# Patient Record
Sex: Female | Born: 1950
Health system: Southern US, Community
[De-identification: ages and names within clinical notes are randomized; demographics above are authoritative.]

## PROBLEM LIST (undated history)

## (undated) DIAGNOSIS — E119 Type 2 diabetes mellitus without complications: Secondary | ICD-10-CM

## (undated) DIAGNOSIS — C419 Malignant neoplasm of bone and articular cartilage, unspecified: Secondary | ICD-10-CM

## (undated) DIAGNOSIS — C50919 Malignant neoplasm of unspecified site of unspecified female breast: Secondary | ICD-10-CM

## (undated) DIAGNOSIS — Z9221 Personal history of antineoplastic chemotherapy: Secondary | ICD-10-CM

## (undated) DIAGNOSIS — E785 Hyperlipidemia, unspecified: Secondary | ICD-10-CM

## (undated) DIAGNOSIS — I10 Essential (primary) hypertension: Secondary | ICD-10-CM

## (undated) HISTORY — DX: Hyperlipidemia, unspecified: E78.5

## (undated) HISTORY — DX: Type 2 diabetes mellitus without complications: E11.9

## (undated) HISTORY — DX: Malignant neoplasm of bone and articular cartilage, unspecified: C41.9

## (undated) HISTORY — DX: Malignant neoplasm of unspecified site of unspecified female breast: C50.919

## (undated) HISTORY — DX: Essential (primary) hypertension: I10

## (undated) HISTORY — PX: MASTECTOMY: SHX3

## (undated) HISTORY — PX: APPENDECTOMY: SHX54

---

## 1997-07-08 ENCOUNTER — Other Ambulatory Visit: Admission: RE | Admit: 1997-07-08 | Discharge: 1997-07-08 | Payer: Self-pay | Admitting: Obstetrics and Gynecology

## 1997-08-21 ENCOUNTER — Ambulatory Visit (HOSPITAL_COMMUNITY): Admission: RE | Admit: 1997-08-21 | Discharge: 1997-08-21 | Payer: Self-pay | Admitting: Family Medicine

## 1997-08-25 ENCOUNTER — Ambulatory Visit (HOSPITAL_COMMUNITY): Admission: RE | Admit: 1997-08-25 | Discharge: 1997-08-25 | Payer: Self-pay | Admitting: Family Medicine

## 1998-01-09 DIAGNOSIS — C50919 Malignant neoplasm of unspecified site of unspecified female breast: Secondary | ICD-10-CM

## 1998-01-09 HISTORY — DX: Malignant neoplasm of unspecified site of unspecified female breast: C50.919

## 1998-01-13 ENCOUNTER — Ambulatory Visit (HOSPITAL_COMMUNITY): Admission: RE | Admit: 1998-01-13 | Discharge: 1998-01-13 | Payer: Self-pay | Admitting: *Deleted

## 1998-02-05 ENCOUNTER — Inpatient Hospital Stay (HOSPITAL_COMMUNITY): Admission: RE | Admit: 1998-02-05 | Discharge: 1998-02-08 | Payer: Self-pay | Admitting: *Deleted

## 1998-03-19 ENCOUNTER — Ambulatory Visit (HOSPITAL_COMMUNITY): Admission: RE | Admit: 1998-03-19 | Discharge: 1998-03-19 | Payer: Self-pay | Admitting: *Deleted

## 1998-03-30 ENCOUNTER — Encounter: Payer: Self-pay | Admitting: Oncology

## 1998-03-30 ENCOUNTER — Ambulatory Visit (HOSPITAL_COMMUNITY): Admission: RE | Admit: 1998-03-30 | Discharge: 1998-03-30 | Payer: Self-pay | Admitting: Oncology

## 1998-07-27 ENCOUNTER — Other Ambulatory Visit: Admission: RE | Admit: 1998-07-27 | Discharge: 1998-07-27 | Payer: Self-pay | Admitting: Obstetrics and Gynecology

## 1998-08-11 ENCOUNTER — Ambulatory Visit (HOSPITAL_BASED_OUTPATIENT_CLINIC_OR_DEPARTMENT_OTHER): Admission: RE | Admit: 1998-08-11 | Discharge: 1998-08-11 | Payer: Self-pay | Admitting: Plastic Surgery

## 1998-11-05 ENCOUNTER — Ambulatory Visit (HOSPITAL_BASED_OUTPATIENT_CLINIC_OR_DEPARTMENT_OTHER): Admission: RE | Admit: 1998-11-05 | Discharge: 1998-11-05 | Payer: Self-pay | Admitting: Plastic Surgery

## 1998-12-28 ENCOUNTER — Ambulatory Visit (HOSPITAL_COMMUNITY): Admission: RE | Admit: 1998-12-28 | Discharge: 1998-12-28 | Payer: Self-pay | Admitting: Oncology

## 1998-12-28 ENCOUNTER — Encounter: Payer: Self-pay | Admitting: Oncology

## 1999-09-05 ENCOUNTER — Encounter: Admission: RE | Admit: 1999-09-05 | Discharge: 1999-12-04 | Payer: Self-pay | Admitting: Family Medicine

## 1999-09-21 ENCOUNTER — Other Ambulatory Visit: Admission: RE | Admit: 1999-09-21 | Discharge: 1999-09-21 | Payer: Self-pay | Admitting: Obstetrics and Gynecology

## 1999-12-30 ENCOUNTER — Encounter: Admission: RE | Admit: 1999-12-30 | Discharge: 1999-12-30 | Payer: Self-pay | Admitting: Oncology

## 1999-12-30 ENCOUNTER — Encounter: Payer: Self-pay | Admitting: Oncology

## 2000-01-11 ENCOUNTER — Encounter: Admission: RE | Admit: 2000-01-11 | Discharge: 2000-04-10 | Payer: Self-pay | Admitting: Family Medicine

## 2000-09-21 ENCOUNTER — Other Ambulatory Visit: Admission: RE | Admit: 2000-09-21 | Discharge: 2000-09-21 | Payer: Self-pay | Admitting: Obstetrics and Gynecology

## 2001-01-08 ENCOUNTER — Encounter: Admission: RE | Admit: 2001-01-08 | Discharge: 2001-01-08 | Payer: Self-pay | Admitting: Oncology

## 2001-01-08 ENCOUNTER — Encounter: Payer: Self-pay | Admitting: Oncology

## 2001-06-20 ENCOUNTER — Encounter: Admission: RE | Admit: 2001-06-20 | Discharge: 2001-06-20 | Payer: Self-pay | Admitting: Oncology

## 2001-06-20 ENCOUNTER — Encounter: Payer: Self-pay | Admitting: Oncology

## 2001-10-31 ENCOUNTER — Other Ambulatory Visit: Admission: RE | Admit: 2001-10-31 | Discharge: 2001-10-31 | Payer: Self-pay | Admitting: Obstetrics and Gynecology

## 2002-06-25 ENCOUNTER — Encounter: Payer: Self-pay | Admitting: General Surgery

## 2002-06-25 ENCOUNTER — Encounter: Admission: RE | Admit: 2002-06-25 | Discharge: 2002-06-25 | Payer: Self-pay | Admitting: General Surgery

## 2002-11-20 ENCOUNTER — Other Ambulatory Visit: Admission: RE | Admit: 2002-11-20 | Discharge: 2002-11-20 | Payer: Self-pay | Admitting: Obstetrics and Gynecology

## 2003-07-08 ENCOUNTER — Encounter: Admission: RE | Admit: 2003-07-08 | Discharge: 2003-07-08 | Payer: Self-pay | Admitting: General Surgery

## 2003-12-31 ENCOUNTER — Other Ambulatory Visit: Admission: RE | Admit: 2003-12-31 | Discharge: 2003-12-31 | Payer: Self-pay | Admitting: Obstetrics and Gynecology

## 2004-01-25 ENCOUNTER — Encounter: Admission: RE | Admit: 2004-01-25 | Discharge: 2004-01-25 | Payer: Self-pay

## 2004-03-23 ENCOUNTER — Ambulatory Visit: Payer: Self-pay | Admitting: Oncology

## 2004-07-13 ENCOUNTER — Encounter: Admission: RE | Admit: 2004-07-13 | Discharge: 2004-07-13 | Payer: Self-pay | Admitting: Oncology

## 2005-03-22 ENCOUNTER — Ambulatory Visit: Payer: Self-pay | Admitting: Oncology

## 2005-08-17 ENCOUNTER — Encounter: Admission: RE | Admit: 2005-08-17 | Discharge: 2005-08-17 | Payer: Self-pay | Admitting: Oncology

## 2006-03-19 ENCOUNTER — Ambulatory Visit: Payer: Self-pay | Admitting: Oncology

## 2006-08-22 ENCOUNTER — Encounter: Admission: RE | Admit: 2006-08-22 | Discharge: 2006-08-22 | Payer: Self-pay | Admitting: Oncology

## 2007-03-19 ENCOUNTER — Ambulatory Visit: Payer: Self-pay | Admitting: Oncology

## 2007-08-26 ENCOUNTER — Encounter: Admission: RE | Admit: 2007-08-26 | Discharge: 2007-08-26 | Payer: Self-pay | Admitting: Oncology

## 2008-03-24 ENCOUNTER — Ambulatory Visit: Payer: Self-pay | Admitting: Oncology

## 2008-08-25 ENCOUNTER — Encounter: Admission: RE | Admit: 2008-08-25 | Discharge: 2008-08-25 | Payer: Self-pay | Admitting: Family Medicine

## 2008-08-26 ENCOUNTER — Encounter: Admission: RE | Admit: 2008-08-26 | Discharge: 2008-08-26 | Payer: Self-pay | Admitting: Oncology

## 2009-03-25 ENCOUNTER — Ambulatory Visit: Payer: Self-pay | Admitting: Oncology

## 2009-08-30 ENCOUNTER — Encounter: Admission: RE | Admit: 2009-08-30 | Discharge: 2009-08-30 | Payer: Self-pay | Admitting: Oncology

## 2009-11-19 ENCOUNTER — Encounter: Admission: RE | Admit: 2009-11-19 | Discharge: 2009-11-19 | Payer: Self-pay | Admitting: Family Medicine

## 2010-01-30 ENCOUNTER — Encounter: Payer: Self-pay | Admitting: Oncology

## 2010-03-31 ENCOUNTER — Encounter (HOSPITAL_BASED_OUTPATIENT_CLINIC_OR_DEPARTMENT_OTHER): Payer: BC Managed Care – PPO | Admitting: Oncology

## 2010-03-31 DIAGNOSIS — C50319 Malignant neoplasm of lower-inner quadrant of unspecified female breast: Secondary | ICD-10-CM

## 2010-03-31 DIAGNOSIS — Z853 Personal history of malignant neoplasm of breast: Secondary | ICD-10-CM

## 2010-07-11 ENCOUNTER — Other Ambulatory Visit: Payer: Self-pay | Admitting: Oncology

## 2010-07-11 DIAGNOSIS — Z9010 Acquired absence of unspecified breast and nipple: Secondary | ICD-10-CM

## 2010-07-15 ENCOUNTER — Other Ambulatory Visit: Payer: Self-pay | Admitting: Internal Medicine

## 2010-07-15 DIAGNOSIS — E041 Nontoxic single thyroid nodule: Secondary | ICD-10-CM

## 2010-07-26 ENCOUNTER — Other Ambulatory Visit (HOSPITAL_COMMUNITY)
Admission: RE | Admit: 2010-07-26 | Discharge: 2010-07-26 | Disposition: A | Discharge status: Home / Self Care | Payer: BC Managed Care – PPO | Source: Ambulatory Visit | Attending: Interventional Radiology | Admitting: Interventional Radiology

## 2010-07-26 ENCOUNTER — Ambulatory Visit
Admission: RE | Admit: 2010-07-26 | Discharge: 2010-07-26 | Disposition: A | Discharge status: Home / Self Care | Payer: BC Managed Care – PPO | Source: Ambulatory Visit | Attending: Internal Medicine | Admitting: Internal Medicine

## 2010-07-26 DIAGNOSIS — E041 Nontoxic single thyroid nodule: Secondary | ICD-10-CM

## 2010-07-26 DIAGNOSIS — E049 Nontoxic goiter, unspecified: Secondary | ICD-10-CM | POA: Insufficient documentation

## 2010-09-01 ENCOUNTER — Ambulatory Visit: Payer: BC Managed Care – PPO

## 2010-09-05 ENCOUNTER — Ambulatory Visit
Admission: RE | Admit: 2010-09-05 | Discharge: 2010-09-05 | Disposition: A | Discharge status: Home / Self Care | Payer: BC Managed Care – PPO | Source: Ambulatory Visit | Attending: Oncology | Admitting: Oncology

## 2010-09-05 DIAGNOSIS — Z9010 Acquired absence of unspecified breast and nipple: Secondary | ICD-10-CM

## 2010-10-18 ENCOUNTER — Other Ambulatory Visit: Payer: Self-pay | Admitting: Internal Medicine

## 2010-10-18 DIAGNOSIS — E042 Nontoxic multinodular goiter: Secondary | ICD-10-CM

## 2010-11-21 ENCOUNTER — Other Ambulatory Visit: Payer: BC Managed Care – PPO

## 2011-03-13 ENCOUNTER — Telehealth: Payer: Self-pay | Admitting: Oncology

## 2011-03-13 NOTE — Telephone Encounter (Signed)
lmonvm for pt re appt for 4/1. Schedule mailed.

## 2011-04-07 ENCOUNTER — Telehealth: Payer: Self-pay | Admitting: Oncology

## 2011-04-07 NOTE — Telephone Encounter (Signed)
pt called and r/s appt on 4-01 to 6-17

## 2011-04-10 ENCOUNTER — Ambulatory Visit: Payer: BC Managed Care – PPO | Admitting: Oncology

## 2011-06-26 ENCOUNTER — Ambulatory Visit: Payer: BC Managed Care – PPO | Admitting: Oncology

## 2011-07-14 ENCOUNTER — Other Ambulatory Visit: Payer: Self-pay | Admitting: Oncology

## 2011-07-14 DIAGNOSIS — Z1231 Encounter for screening mammogram for malignant neoplasm of breast: Secondary | ICD-10-CM

## 2011-07-21 ENCOUNTER — Ambulatory Visit (HOSPITAL_BASED_OUTPATIENT_CLINIC_OR_DEPARTMENT_OTHER): Payer: BC Managed Care – PPO | Admitting: Oncology

## 2011-07-21 ENCOUNTER — Telehealth: Payer: Self-pay | Admitting: Oncology

## 2011-07-21 ENCOUNTER — Encounter: Payer: Self-pay | Admitting: *Deleted

## 2011-07-21 VITALS — BP 144/82 | HR 77 | Temp 99.1°F | Ht 62.0 in | Wt 163.1 lb

## 2011-07-21 DIAGNOSIS — C50919 Malignant neoplasm of unspecified site of unspecified female breast: Secondary | ICD-10-CM

## 2011-07-21 NOTE — Progress Notes (Signed)
   [facility]    OFFICE PROGRESS NOTE   INTERVAL HISTORY:   She returns as scheduled. No complaint. Good energy level. No change at the right chest wall or left breast. A mammogram in August of 2012 was negative.  Objective:  Vital signs in last 24 hours:  Blood pressure 144/82, pulse 77, temperature 99.1 F (37.3 C), temperature source Oral, height 5\' 2"  (1.575 m), weight 163 lb 1.6 oz (73.982 kg).    HEENT: Neck without mass Lymphatics: No cervical, supraclavicular, or right axillary nodes. Prominent high medial left axillary fat pad versus a soft mobile 1 cm node. Resp: Lungs clear bilaterally Cardio: Regular rate and rhythm GI: No hepatomegaly Vascular: No leg edema, the left ankle area is slightly larger than the right side. Breasts: Status post right mastectomy with a TRAM reconstruction. No evidence for chest wall tumor recurrence. Left breast without mass.     Medications: I have reviewed the patient's current medications.  Assessment/Plan: Ms. Verdi has a history of Stage II-A right-sided breast cancer diagnosed in January of 2000.  She completed adjuvant Femara therapy at the end of July of 2010.  She remains in clinical remission.    Disposition:  She would like to continue followup at the cancer Center. She will be scheduled for a mammogram in September of 2013. Ms. Bovey will return for an office visit in one year.   Thornton Papas, MD  07/21/2011  9:06 AM

## 2011-07-21 NOTE — Telephone Encounter (Signed)
gve the pt her July 2014 appt calendar °

## 2011-09-06 ENCOUNTER — Ambulatory Visit
Admission: RE | Admit: 2011-09-06 | Discharge: 2011-09-06 | Disposition: A | Discharge status: Home / Self Care | Payer: BC Managed Care – PPO | Source: Ambulatory Visit | Attending: Oncology | Admitting: Oncology

## 2011-09-06 DIAGNOSIS — Z1231 Encounter for screening mammogram for malignant neoplasm of breast: Secondary | ICD-10-CM

## 2012-07-22 ENCOUNTER — Ambulatory Visit: Payer: BC Managed Care – PPO | Admitting: Oncology

## 2012-08-27 ENCOUNTER — Other Ambulatory Visit: Payer: Self-pay

## 2012-08-27 DIAGNOSIS — Z9011 Acquired absence of right breast and nipple: Secondary | ICD-10-CM

## 2012-08-27 DIAGNOSIS — Z1231 Encounter for screening mammogram for malignant neoplasm of breast: Secondary | ICD-10-CM

## 2012-09-17 ENCOUNTER — Ambulatory Visit
Admission: RE | Admit: 2012-09-17 | Discharge: 2012-09-17 | Disposition: A | Discharge status: Home / Self Care | Payer: BC Managed Care – PPO | Source: Ambulatory Visit

## 2012-09-17 DIAGNOSIS — Z1231 Encounter for screening mammogram for malignant neoplasm of breast: Secondary | ICD-10-CM

## 2012-09-17 DIAGNOSIS — Z9011 Acquired absence of right breast and nipple: Secondary | ICD-10-CM

## 2013-01-16 ENCOUNTER — Other Ambulatory Visit: Payer: Self-pay | Admitting: Obstetrics and Gynecology

## 2013-02-12 ENCOUNTER — Other Ambulatory Visit (HOSPITAL_COMMUNITY): Payer: Self-pay | Admitting: Orthopedic Surgery

## 2013-02-12 DIAGNOSIS — C50919 Malignant neoplasm of unspecified site of unspecified female breast: Secondary | ICD-10-CM

## 2013-02-17 ENCOUNTER — Encounter (HOSPITAL_COMMUNITY)
Admission: RE | Admit: 2013-02-17 | Discharge: 2013-02-17 | Disposition: A | Discharge status: Home / Self Care | Payer: BC Managed Care – PPO | Source: Ambulatory Visit | Attending: Orthopedic Surgery | Admitting: Orthopedic Surgery

## 2013-02-17 ENCOUNTER — Ambulatory Visit (HOSPITAL_COMMUNITY)
Admission: RE | Admit: 2013-02-17 | Discharge: 2013-02-17 | Disposition: A | Discharge status: Home / Self Care | Payer: BC Managed Care – PPO | Source: Ambulatory Visit | Attending: Orthopedic Surgery | Admitting: Orthopedic Surgery

## 2013-02-17 DIAGNOSIS — C50919 Malignant neoplasm of unspecified site of unspecified female breast: Secondary | ICD-10-CM | POA: Insufficient documentation

## 2013-02-17 DIAGNOSIS — M25559 Pain in unspecified hip: Secondary | ICD-10-CM | POA: Insufficient documentation

## 2013-02-17 DIAGNOSIS — R948 Abnormal results of function studies of other organs and systems: Secondary | ICD-10-CM | POA: Insufficient documentation

## 2013-02-17 MED ORDER — TECHNETIUM TC 99M MEDRONATE IV KIT
25.0000 | PACK | Freq: Once | INTRAVENOUS | Status: AC | PRN
  Administered 2013-02-17: 25 via INTRAVENOUS

## 2013-02-18 ENCOUNTER — Telehealth: Payer: Self-pay | Admitting: *Deleted

## 2013-02-18 NOTE — Telephone Encounter (Signed)
Patient called to report recent xrays-MRI is suspicious for bone mets in her right hip. Dr. Elsie Saas told her to call her oncologist. She is requesting appointment as soon as possible.

## 2013-02-18 NOTE — Telephone Encounter (Signed)
Called patient with her appointment with Dr. Benay Spice for 2/12 at 3:30 pm (1st available per [facility]).

## 2013-02-18 NOTE — Telephone Encounter (Signed)
Called Dr. Archie Endo office and requested copy of last office note and all xray and MRI reports available be sent to office today. We have access to the bone scan.

## 2013-02-20 ENCOUNTER — Ambulatory Visit (HOSPITAL_BASED_OUTPATIENT_CLINIC_OR_DEPARTMENT_OTHER): Payer: BC Managed Care – PPO | Admitting: Oncology

## 2013-02-20 ENCOUNTER — Encounter (INDEPENDENT_AMBULATORY_CARE_PROVIDER_SITE_OTHER): Payer: Self-pay

## 2013-02-20 ENCOUNTER — Telehealth: Payer: Self-pay | Admitting: Oncology

## 2013-02-20 VITALS — BP 197/82 | HR 99 | Temp 98.4°F | Resp 19 | Ht 62.0 in | Wt 148.3 lb

## 2013-02-20 DIAGNOSIS — R109 Unspecified abdominal pain: Secondary | ICD-10-CM

## 2013-02-20 DIAGNOSIS — Z853 Personal history of malignant neoplasm of breast: Secondary | ICD-10-CM

## 2013-02-20 DIAGNOSIS — M899 Disorder of bone, unspecified: Secondary | ICD-10-CM

## 2013-02-20 DIAGNOSIS — C50919 Malignant neoplasm of unspecified site of unspecified female breast: Secondary | ICD-10-CM

## 2013-02-20 DIAGNOSIS — M949 Disorder of cartilage, unspecified: Secondary | ICD-10-CM

## 2013-02-20 NOTE — Progress Notes (Signed)
[facility]    OFFICE PROGRESS NOTE   INTERVAL HISTORY:   Mr. Stoll returns for an unscheduled visit. She was last seen at the [facility] in July of 2013. She reports the onset of pain in the right groin with position change and weightbearing beginning in October of 2014. The pain became worse in December of 2014. She saw her gynecologist and reports an examination was unremarkable. Ms. Hor was referred to Dr. Noemi Chapel  An MRI of the right hip on 02/11/2013 revealed marrow edema in the superior acetabulum with multiple linear signal abnormalities in the superior acetabulum consistent with a fracture. The area in enhancement on postcontrast images. No other focal marrow signal abnormality. The soft tissues appear normal. This was felt to represent a benign insufficiency fracture versus a pathologic fracture.  She was referred for a bone scan on 02/17/2013. Increased tracer activity was noted at the right acetabulum extending into the inferior right ilium suspicious for metastatic disease. No other evidence of metastatic disease. Minimal uptake at the left lateral aspect of the mid cervical spine was felt to be degenerative.  Ms. Timberlake has been referred to Dr. Leonides Schanz in [facility]. She is scheduled for an appointment to 16 2015.  She feels well aside from the right hip discomfort. No anorexia, dyspnea, change over the chest, or additional sites of pain. She relates weight loss to a change in her diabetic regimen.  Objective:  Vital signs in last 24 hours:  There were no vitals taken for this visit.    HEENT: Neck without mass Lymphatics: No cervical, supraclavicular, axillary, or inguinal nodes Resp: Lungs clear bilaterally Cardio: Regular rate and rhythm GI: No hepatosplenomegaly, nontender, no mass Vascular: No leg edema Breasts: Status post right mastectomy with a TRAM reconstruction. No evidence for chest wall tumor recurrence. Left breast without mass.    Musculoskeletal: No spine tenderness. No tenderness over the right iliac. Mild pain with internal rotation at the right hip.   X-rays: I reviewed the 02/17/2013 bone scan and the 02/11/2013 right hip MRI with a radiologist and with Ms. Stickels.   Medications: I have reviewed the patient's current medications.  Assessment/Plan:  1. Breast cancer-stage IIA (T2 N0) right breast cancer diagnosed in January 2000, status post a right mastectomy and TRAM reconstruction. She completed adjuvant chemotherapy followed by 5 years of tamoxifen and then 5 years of Femara. She completed Femara at the end of July 2010.  2. Right groin pain with an MRI and bone scan February 2015 concerning for metastatic disease involving the right acetabulum/ileum  Disposition:  I discussed the radiologic findings and reviewed the MRI/bone scan images with Ms. Proehl and her husband. We discussed the differential diagnosis which includes breast cancer, another malignancy, and a benign process. The most likely diagnosis is metastatic breast cancer, but it would be unusual for breast cancer to recur in an isolated site 15 years out from diagnosis.  I will refer her for a CT-guided biopsy of the right ilium. She is scheduled to see Dr. Leonides Schanz on 02/24/2013. He can cancel this procedure if he feels this is not indicated. She will return for an office visit on 02/28/2013. She will limit weight bearing on the right leg.  Mr. Mun will return 02/21/2013 for a CBC, chemistry panel, LDH, and CA 27.29. We will schedule additional staging studies pending the biopsy result. I have requested her old chart to review the details of the original pathology report and chemotherapy records.  Approximately 45 minutes were spent with patient today. The majority of the time was used for counseling and coordination of care.   Betsy Coder, MD  02/20/2013  2:41 PM

## 2013-02-20 NOTE — Telephone Encounter (Signed)
Gave pt appt for Md onlt for february 2015

## 2013-02-21 ENCOUNTER — Ambulatory Visit (HOSPITAL_BASED_OUTPATIENT_CLINIC_OR_DEPARTMENT_OTHER): Payer: BC Managed Care – PPO

## 2013-02-21 DIAGNOSIS — Z853 Personal history of malignant neoplasm of breast: Secondary | ICD-10-CM

## 2013-02-21 DIAGNOSIS — C50919 Malignant neoplasm of unspecified site of unspecified female breast: Secondary | ICD-10-CM

## 2013-02-21 LAB — COMPREHENSIVE METABOLIC PANEL (CC13)
ALBUMIN: 4 g/dL (ref 3.5–5.0)
ALK PHOS: 88 U/L (ref 40–150)
ALT: 14 U/L (ref 0–55)
AST: 12 U/L (ref 5–34)
Anion Gap: 12 mEq/L — ABNORMAL HIGH (ref 3–11)
BILIRUBIN TOTAL: 0.47 mg/dL (ref 0.20–1.20)
BUN: 20.1 mg/dL (ref 7.0–26.0)
CHLORIDE: 102 meq/L (ref 98–109)
CO2: 27 meq/L (ref 22–29)
Calcium: 10.2 mg/dL (ref 8.4–10.4)
Creatinine: 0.7 mg/dL (ref 0.6–1.1)
Glucose: 120 mg/dl (ref 70–140)
POTASSIUM: 3.9 meq/L (ref 3.5–5.1)
SODIUM: 141 meq/L (ref 136–145)
TOTAL PROTEIN: 7.4 g/dL (ref 6.4–8.3)

## 2013-02-21 LAB — CBC WITH DIFFERENTIAL/PLATELET
BASO%: 0.3 % (ref 0.0–2.0)
Basophils Absolute: 0 10*3/uL (ref 0.0–0.1)
EOS ABS: 0.1 10*3/uL (ref 0.0–0.5)
EOS%: 1.3 % (ref 0.0–7.0)
HEMATOCRIT: 43.9 % (ref 34.8–46.6)
HEMOGLOBIN: 13.8 g/dL (ref 11.6–15.9)
LYMPH#: 2.3 10*3/uL (ref 0.9–3.3)
LYMPH%: 31.6 % (ref 14.0–49.7)
MCH: 26.2 pg (ref 25.1–34.0)
MCHC: 31.4 g/dL — AB (ref 31.5–36.0)
MCV: 83.5 fL (ref 79.5–101.0)
MONO#: 0.6 10*3/uL (ref 0.1–0.9)
MONO%: 8.3 % (ref 0.0–14.0)
NEUT%: 58.5 % (ref 38.4–76.8)
NEUTROS ABS: 4.2 10*3/uL (ref 1.5–6.5)
Platelets: 291 10*3/uL (ref 145–400)
RBC: 5.26 10*6/uL (ref 3.70–5.45)
RDW: 13.3 % (ref 11.2–14.5)
WBC: 7.2 10*3/uL (ref 3.9–10.3)

## 2013-02-21 LAB — LACTATE DEHYDROGENASE (CC13): LDH: 172 U/L (ref 125–245)

## 2013-02-22 LAB — CANCER ANTIGEN 27.29: CA 27.29: 16 U/mL (ref 0–39)

## 2013-02-24 ENCOUNTER — Telehealth: Payer: Self-pay | Admitting: *Deleted

## 2013-02-24 NOTE — Telephone Encounter (Signed)
Saw Dr. Leonides Schanz today and he plans to schedule the biopsy and will call Dr. Benay Spice with results. Confirmed Dr. Benay Spice also spoke with Dr. Leonides Schanz today. Cancel visit for 2/20 and patient will call when biopsy is scheduled to determine follow up.

## 2013-02-26 ENCOUNTER — Telehealth: Payer: Self-pay | Admitting: Oncology

## 2013-02-26 NOTE — Telephone Encounter (Signed)
Faxed pt's whole body scan report to [facility]

## 2013-02-28 ENCOUNTER — Ambulatory Visit: Payer: BC Managed Care – PPO | Admitting: Oncology

## 2013-03-06 ENCOUNTER — Ambulatory Visit
Admission: RE | Admit: 2013-03-06 | Discharge: 2013-03-06 | Disposition: A | Discharge status: Home / Self Care | Payer: BC Managed Care – PPO | Source: Ambulatory Visit | Attending: Radiation Oncology | Admitting: Radiation Oncology

## 2013-03-06 ENCOUNTER — Telehealth: Payer: Self-pay | Admitting: *Deleted

## 2013-03-06 ENCOUNTER — Telehealth: Payer: Self-pay | Admitting: Oncology

## 2013-03-06 ENCOUNTER — Encounter: Payer: Self-pay | Admitting: Radiation Oncology

## 2013-03-06 ENCOUNTER — Ambulatory Visit (HOSPITAL_BASED_OUTPATIENT_CLINIC_OR_DEPARTMENT_OTHER): Payer: BC Managed Care – PPO | Admitting: Nurse Practitioner

## 2013-03-06 VITALS — BP 155/82 | HR 87 | Temp 98.1°F | Resp 18 | Ht 62.0 in | Wt 146.4 lb

## 2013-03-06 VITALS — BP 155/82 | HR 87 | Temp 98.1°F | Resp 18 | Ht 62.0 in | Wt 146.6 lb

## 2013-03-06 DIAGNOSIS — C50919 Malignant neoplasm of unspecified site of unspecified female breast: Secondary | ICD-10-CM | POA: Insufficient documentation

## 2013-03-06 DIAGNOSIS — C7952 Secondary malignant neoplasm of bone marrow: Secondary | ICD-10-CM

## 2013-03-06 DIAGNOSIS — C7951 Secondary malignant neoplasm of bone: Secondary | ICD-10-CM

## 2013-03-06 DIAGNOSIS — R109 Unspecified abdominal pain: Secondary | ICD-10-CM | POA: Insufficient documentation

## 2013-03-06 DIAGNOSIS — Z170 Estrogen receptor positive status [ER+]: Secondary | ICD-10-CM | POA: Insufficient documentation

## 2013-03-06 DIAGNOSIS — Z510 Encounter for antineoplastic radiation therapy: Secondary | ICD-10-CM | POA: Insufficient documentation

## 2013-03-06 MED ORDER — ANASTROZOLE 1 MG PO TABS: 1.0000 mg | ORAL_TABLET | Freq: Every day | ORAL | Status: DC

## 2013-03-06 NOTE — Progress Notes (Signed)
Radiation Oncology         [phone] ________________________________  Initial outpatient Consultation  Name: Cindy Frazier MRN: [id number]  Date: 03/06/2013  DOB: 01/13/1950  TF:[id number] Theda Sers, MD  Ladell Pier, MD   REFERRING PHYSICIAN: Ladell Pier, MD  DIAGNOSIS: 63 yo woman with an Isolated Solitary Right Acetabular Metastasis from ER Positive Breast Cancer - stage rT0 N0 M1  HISTORY OF PRESENT ILLNESS::Cindy Frazier is a 63 y.o. female who diagnosed with stage IIA (T2,N0) right sided breast cancer in 01/1998.  She underwent mastectomy followed by TRAM reconstruction.  She completed a course of adjuvant AC chemotherapy followed by five years of tamoxifen and five years of femara completed in 2010.  She began to notice right hip pain with weight bearing.  She was seen by Dr. Noemi Chapel and MRI suggested a malignant process in the right acetabulum.  Dr. Gwyndolyn Saxon Ward arranged CT biopsy showing ER positive breast cancer.  She has kindly been referred today to discuss radiotherapy.  PREVIOUS RADIATION THERAPY: No  PAST MEDICAL HISTORY:  has a past medical history of Breast cancer (01/1998).    PAST SURGICAL HISTORY:History reviewed. No pertinent past surgical history.  FAMILY HISTORY: family history is not on file.  SOCIAL HISTORY:    ALLERGIES: Erythromycin  MEDICATIONS:  Current Outpatient Prescriptions  Medication Sig Dispense Refill  . Calcium Carbonate-Vitamin D (CALCIUM + D PO) Take 1 tablet by mouth daily.      . CRESTOR 20 MG tablet Take 20 mg by mouth Daily.      . INVOKANA 300 MG TABS Take 300 mg by mouth daily.      Marland Kitchen JANUMET XR 50-1000 MG TB24 Take 2 tablets by mouth daily.       Marland Kitchen losartan-hydrochlorothiazide (HYZAAR) 100-25 MG per tablet Take 1 tablet by mouth Daily.      . meloxicam (MOBIC) 15 MG tablet Take 15 mg by mouth daily.      Marland Kitchen anastrozole (ARIMIDEX) 1 MG tablet Take 1 tablet (1 mg total) by mouth daily.  30 tablet  5   No  current facility-administered medications for this encounter.    REVIEW OF SYSTEMS:  A 15 point review of systems is documented in the electronic medical record. This was obtained by the nursing staff. However, I reviewed this with the patient to discuss relevant findings and make appropriate changes.  Pertinent items are noted in HPI.   PHYSICAL EXAM:  height is 5\' 2"  (1.575 m) and weight is 146 lb 9.6 oz (66.497 kg). Her oral temperature is 98.1 F (36.7 C). Her blood pressure is 155/82 and her pulse is 87. Her respiration is 18 and oxygen saturation is 100%.   Pain localized to right groin area with weight bearing.  KPS = 80  100 - Normal; no complaints; no evidence of disease. 90   - Able to carry on normal activity; minor signs or symptoms of disease. 80   - Normal activity with effort; some signs or symptoms of disease. 64   - Cares for self; unable to carry on normal activity or to do active work. 60   - Requires occasional assistance, but is able to care for most of his personal needs. 50   - Requires considerable assistance and frequent medical care. 42   - Disabled; requires special care and assistance. 17   - Severely disabled; hospital admission is indicated although death not imminent. 3   - Very sick; hospital admission necessary; active  supportive treatment necessary. 10   - Moribund; fatal processes progressing rapidly. 0     - Dead  Karnofsky DA, Abelmann [place], Craver LS and [facility] [phone]) The use of the nitrogen mustards in the palliative treatment of carcinoma: with particular reference to bronchogenic carcinoma Cancer 1 [phone]  LABORATORY DATA:  Lab Results  Component Value Date   WBC 7.2 02/21/2013   HGB 13.8 02/21/2013   HCT 43.9 02/21/2013   MCV 83.5 02/21/2013   PLT 291 02/21/2013   Lab Results  Component Value Date   NA 141 02/21/2013   K 3.9 02/21/2013   CO2 27 02/21/2013   Lab Results  Component Value Date   ALT 14 02/21/2013   AST 12 02/21/2013    ALKPHOS 88 02/21/2013   BILITOT 0.47 02/21/2013     RADIOGRAPHY: Nm Bone Scan Whole Body  02/17/2013   CLINICAL DATA:  Breast cancer, right hip pain down leg since December 2014  EXAM: NUCLEAR MEDICINE WHOLE BODY BONE SCAN  TECHNIQUE: Whole body anterior and posterior images were obtained approximately 3 hours after intravenous injection of radiopharmaceutical.  COMPARISON:  None radiographic correlation:  None recent  RADIOPHARMACEUTICALS:  25 mCi Technetium-5m MDP  FINDINGS: Increased tracer localization identified in the right acetabulum extending at into the inferior right ilium suspicious for metastatic disease.  Minimal uptake at the left lateral aspect of the mid cervical spine, typically degenerative.  Remaining osseous tracer distribution appears normal.  Urinary tract and soft tissue distribution of tracer normal.  IMPRESSION: Increased tracer localization at the right acetabulum extending into the inferior right ilium suspicious for metastatic disease; radiographic correlation recommended.   Electronically Signed   By: Lavonia Dana M.D.   On: 02/17/2013 17:04      IMPRESSION: This patient is a very nice 63 yo woman with a solitary isolated right acetabular metastasis from ER positive breast cancer with a 15 year disease free interval.  She falls into a select subset of patients with oligometastatic disease who may benefit from definitive SBRT.  The instability/early fracture of the acetabulum raise the question of possible future need for surgical stabilization.  In this setting SBRT provides the advantage of minimal radiation exposure to surrounding tissues.  PLAN:Today, I talked to the patient and family about the findings and work-up thus far.  We discussed the natural history of disease and general treatment, highlighting the role of SBRT in the management of oligometastases.  We discussed the available radiation techniques, and focused on the details of logistics and delivery.  We reviewed the  anticipated acute and late sequelae associated with radiation in this setting.  The patient was encouraged to ask questions that I answered to the best of my ability.  I filled out a patient counseling form during our discussion including treatment diagrams.  We retained a copy for our records.  The patient would like to proceed with radiation and will be scheduled for CT simulation.  I spent 60 minutes minutes face to face with the patient and more than 50% of that time was spent in counseling and/or coordination of care.    ------------------------------------------------  Sheral Apley. Tammi Klippel, M.D.

## 2013-03-06 NOTE — Progress Notes (Addendum)
OFFICE PROGRESS NOTE  Interval history:  Ms. Lusignan returns for scheduled followup. She underwent a CT guided right superior acetabular bone biopsy on 02/28/2013. Pathology showed metastatic carcinoma consistent with breast primary; ER positive (90-100%); PR weakly positive (5-10%); HER-2/neu not amplified; Ki-67 20%.  She complains of intermittent pain at the right hip. She describes the pain as an "ache". She also has periodic pain at the right groin. She is taking Advil as needed, typically one time a day. She otherwise feels well. She is ambulating with crutches.   Objective: Filed Vitals:   03/06/13 1114  BP: 155/82  Pulse: 87  Temp: 98.1 F (36.7 C)  Resp: 18   Oropharynx is without thrush or ulceration. No palpable cervical, supraclavicular or axillary lymph nodes. Lungs are clear. No wheezes or rales. Regular cardiac rhythm. Abdomen soft and nontender. No hepatomegaly. No leg edema.   Lab Results: Lab Results  Component Value Date   WBC 7.2 02/21/2013   HGB 13.8 02/21/2013   HCT 43.9 02/21/2013   MCV 83.5 02/21/2013   PLT 291 02/21/2013   NEUTROABS 4.2 02/21/2013    Chemistry:    Chemistry      Component Value Date/Time   NA 141 02/21/2013 1022   K 3.9 02/21/2013 1022   CO2 27 02/21/2013 1022   BUN 20.1 02/21/2013 1022   CREATININE 0.7 02/21/2013 1022      Component Value Date/Time   CALCIUM 10.2 02/21/2013 1022   ALKPHOS 88 02/21/2013 1022   AST 12 02/21/2013 1022   ALT 14 02/21/2013 1022   BILITOT 0.47 02/21/2013 1022       Studies/Results: Nm Bone Scan Whole Body  02/17/2013   CLINICAL DATA:  Breast cancer, right hip pain down leg since December 2014  EXAM: NUCLEAR MEDICINE WHOLE BODY BONE SCAN  TECHNIQUE: Whole body anterior and posterior images were obtained approximately 3 hours after intravenous injection of radiopharmaceutical.  COMPARISON:  None radiographic correlation:  None recent  RADIOPHARMACEUTICALS:  25 mCi Technetium-47mMDP  FINDINGS: Increased tracer  localization identified in the right acetabulum extending at into the inferior right ilium suspicious for metastatic disease.  Minimal uptake at the left lateral aspect of the mid cervical spine, typically degenerative.  Remaining osseous tracer distribution appears normal.  Urinary tract and soft tissue distribution of tracer normal.  IMPRESSION: Increased tracer localization at the right acetabulum extending into the inferior right ilium suspicious for metastatic disease; radiographic correlation recommended.   Electronically Signed   By: MLavonia DanaM.D.   On: 02/17/2013 17:04    Medications: I have reviewed the patient's current medications.  Assessment/Plan: 1. Breast cancer-stage IIA (T2 N0) right breast cancer diagnosed in January 2000, status post a right mastectomy and TRAM reconstruction. She completed adjuvant chemotherapy followed by 5 years of tamoxifen and then 5 years of Femara. She completed Femara at the end of July 2010.  2. Right groin pain with an MRI and bone scan February 2015 concerning for metastatic disease involving the right acetabulum/ileum. 3. Status pos tCT guided right superior acetabular bone biopsy on 02/28/2013. Pathology showed metastatic carcinoma consistent with breast primary; ER positive (90-100%); PR weakly positive (5-10%); HER-2/neu not amplified; Ki-67 20%. 4. CT chest/abdomen/pelvis 02/28/2013 with mild thyroid enlargement and multiple low-attenuation lesions probably due to multinodular goiter; suggestion of asymmetry in the deep lateral central left breast; cholelithiasis; small left renal cysts; degenerative changes throughout the spine; no lymphadenopathy in the abdomen or pelvis; no concerning solid organ lesions; mixed lytic  and sclerotic destructive lesion of the right acetabulum.   Dispositon-she appears to have metastatic breast cancer to bone, ER positive. Staging evaluation shows no other evidence of metastatic disease aside from the right  acetabulum.  Dr. Benay Spice recommends a referral to radiation oncology. Dr. Tammi Klippel will see Ms. Mcluckie later today.   Following completion of radiation she will begin Arimidex. We reviewed potential side effects associated with Arimidex including hot flashes, arthralgias, osteoporosis. She was provided written information as well.   Dr. Benay Spice also recommended Zometa on a 3-6 month schedule. We reviewed potential toxicities including flulike symptoms and osteonecrosis of the jaw. She was provided written information.  She is agreeable to proceeding with radiation and Arimidex. She is unsure if she will proceed with Zometa. She will let us know her decision.  We will see her in followup in approximately one month. She will contact the office in the interim with any problems.   Patient seen with Dr. Benay Spice. 25 minutes were spent face-to-face at today's visit with the majority of that time involved in counseling/coordination of care.   [name] ANP/GNP-BC    This was a shared visit with [name].   I discussed the case with Dr. Leonides Schanz earlier today. We reviewed the biopsy findings with Ms. Renz. She has been diagnosed with ER positive metastatic breast cancer. She appears to have an isolated metastasis to the right ilium. Staging CT scans and a bone scan reveal no additional evidence of metastatic disease.  I will refer her for radiation to the right pelvis. She will then begin Arimidex. I also recommend Zometa. We discussed the potential toxicities associated with Arimidex and Zometa. This included a discussion of osteonecrosis of the jaw.  Dr. Tammi Klippel will see her today for radiation planning.  She will see Dr. Leonides Schanz for followup imaging of the right pelvis in approximately 3 months.  Julieanne Manson, M.D.

## 2013-03-06 NOTE — Progress Notes (Signed)
Expresses a little frustration explaining that this process began January 8th followed by multiple test but, no treatment. PET scan scheduled for 3/6. Hopes to be simulated today to see some progress. Reports intermittent ache in right hip for which motrin resolved. Prescribed mobic but, denies taking in two weeks. Denies difficulty sleeping related to pain confirming she can normally find a comfortable position. Reports occasional pain with lateral movement. Steady gait noted while carrying crutches. Denies weight loss, night sweats, nausea, vomiting, headache, dizziness or diarrhea. Denies numbness in right lower extremity.

## 2013-03-06 NOTE — Telephone Encounter (Signed)
Call from pt, she saw Dr. Leonides Schanz and was given the diagnosis of metastatic carcinoma. Would like an appt ASAP to discuss treatment options.

## 2013-03-06 NOTE — Progress Notes (Signed)
See progress note under physician encounter. 

## 2013-03-06 NOTE — Telephone Encounter (Signed)
PET Scan cancelled per POF, pt notified

## 2013-03-06 NOTE — Telephone Encounter (Signed)
gave pt appt for MD for MArch, pt does not want to schedule Zometa, she wants to research it and decide later, called radiation and schdule

## 2013-03-06 NOTE — Telephone Encounter (Signed)
Returned call to pt, Dr. Benay Spice discussed case with Dr. Leonides Schanz. Offered to work pt in today to discuss treatment options. She agrees to come in.

## 2013-03-07 ENCOUNTER — Encounter: Payer: Self-pay | Admitting: Radiation Oncology

## 2013-03-07 NOTE — Progress Notes (Signed)
  Radiation Oncology         [phone] ________________________________  Name: Cindy Frazier MRN: [id number]  Date: 03/06/2013  DOB: [birth year]  STEREOTACTIC BODY RADIOTHERAPY SIMULATION AND TREATMENT PLANNING NOTE  DIAGNOSIS:  63 yo woman with a solitary acetabular metastasis from ER positive breast cancer with a 15 year disease free interval.  NARRATIVE:  The patient was brought to the [facility].  Identity was confirmed.  All relevant records and images related to the planned course of therapy were reviewed.  The patient freely provided informed written consent to proceed with treatment after reviewing the details related to the planned course of therapy. The consent form was witnessed and verified by the simulation staff.  Then, the patient was set-up in a stable reproducible  supine position for radiation therapy.  A BodyFix immobilization pillow was fabricated for reproducible positioning.  Surface markings were placed.  The CT images were loaded into the planning software.  The gross target volumes (GTV) and planning target volumes (PTV) were delinieated, and avoidance structures were contoured.  Treatment planning then occurred.  The radiation prescription was entered and confirmed.  A total of two complex treatment devices were fabricated in the form of the BodyFix immobilization pillow and a neck accuform cushion.  I have requested : 3D Simulation  I have requested a DVH of the following structures: targets and all normal structures near the target as outlined on the radiation plan to maintain doses in adherence with established limits  PLAN:  The patient will receive 50 Gy in 5 fractions.  ________________________________  Sheral Apley Tammi Klippel, M.D.

## 2013-03-14 ENCOUNTER — Ambulatory Visit (HOSPITAL_COMMUNITY): Payer: BC Managed Care – PPO

## 2013-03-17 ENCOUNTER — Ambulatory Visit
Admission: RE | Admit: 2013-03-17 | Discharge: 2013-03-17 | Disposition: A | Discharge status: Home / Self Care | Payer: BC Managed Care – PPO | Source: Ambulatory Visit | Attending: Radiation Oncology | Admitting: Radiation Oncology

## 2013-03-17 ENCOUNTER — Telehealth: Payer: Self-pay | Admitting: *Deleted

## 2013-03-17 DIAGNOSIS — C7952 Secondary malignant neoplasm of bone marrow: Principal | ICD-10-CM

## 2013-03-17 DIAGNOSIS — C7951 Secondary malignant neoplasm of bone: Secondary | ICD-10-CM

## 2013-03-17 NOTE — Telephone Encounter (Signed)
sw pt gv appt for tx 03/21/13@ 3:45pm. Pt is aware...td

## 2013-03-17 NOTE — Progress Notes (Signed)
  Radiation Oncology         [phone] ________________________________  Name: Cindy Frazier MRN: [id number]  Date: 03/17/2013  DOB: 10/16/1950  Stereotactic Body Radiotherapy Treatment Procedure Note  CURRENT FRACTION:    1  PLANNED FRACTIONS:  5  NARRATIVE:  Cindy Frazier was brought to the stereotactic radiation treatment machine and placed supine on the CT couch. The patient was set up for stereotactic body radiotherapy on the body fix pillow.  3D TREATMENT PLANNING AND DOSIMETRY:  The patient's radiation plan was reviewed and approved prior to starting treatment.  It showed 3-dimensional radiation distributions overlaid onto the planning CT.  The [facility] for the target structures as well as the organs at risk were reviewed. The documentation of this is filed in the radiation oncology EMR.  SIMULATION VERIFICATION:  The patient underwent CT imaging on the treatment unit.  These were carefully aligned to document that the ablative radiation dose would cover the target volume and maximally spare the nearby organs at risk according to the planned distribution.  SPECIAL TREATMENT PROCEDURE: Cindy Frazier received high dose ablative stereotactic body radiotherapy to the planned target volume without unforeseen complications. Treatment was delivered uneventfully. The high doses associated with stereotactic body radiotherapy and the significant potential risks require careful treatment set up and patient monitoring constituting a special treatment procedure   STEREOTACTIC TREATMENT MANAGEMENT:  Following delivery, the patient was evaluated clinically. The patient tolerated treatment without significant acute effects, and was discharged to home in stable condition.    PLAN: Continue treatment as planned.  ________________________________  Sheral Apley. Tammi Klippel, M.D.

## 2013-03-17 NOTE — Telephone Encounter (Signed)
Per staff message and POF I have scheduled appts.  JMW  

## 2013-03-19 ENCOUNTER — Ambulatory Visit
Admission: RE | Admit: 2013-03-19 | Discharge: 2013-03-19 | Disposition: A | Discharge status: Home / Self Care | Payer: BC Managed Care – PPO | Source: Ambulatory Visit | Attending: Radiation Oncology | Admitting: Radiation Oncology

## 2013-03-19 DIAGNOSIS — C7952 Secondary malignant neoplasm of bone marrow: Principal | ICD-10-CM

## 2013-03-19 DIAGNOSIS — C7951 Secondary malignant neoplasm of bone: Secondary | ICD-10-CM

## 2013-03-21 ENCOUNTER — Other Ambulatory Visit: Payer: Self-pay | Admitting: *Deleted

## 2013-03-21 ENCOUNTER — Encounter: Payer: Self-pay | Admitting: Radiation Oncology

## 2013-03-21 ENCOUNTER — Ambulatory Visit
Admission: RE | Admit: 2013-03-21 | Discharge: 2013-03-21 | Disposition: A | Discharge status: Home / Self Care | Payer: BC Managed Care – PPO | Source: Ambulatory Visit | Attending: Radiation Oncology | Admitting: Radiation Oncology

## 2013-03-21 ENCOUNTER — Other Ambulatory Visit: Payer: Self-pay | Admitting: Nurse Practitioner

## 2013-03-21 ENCOUNTER — Ambulatory Visit (HOSPITAL_BASED_OUTPATIENT_CLINIC_OR_DEPARTMENT_OTHER): Payer: BC Managed Care – PPO

## 2013-03-21 VITALS — BP 139/81 | HR 120 | Resp 18

## 2013-03-21 VITALS — BP 160/80 | HR 101 | Temp 98.7°F | Resp 18

## 2013-03-21 DIAGNOSIS — C7951 Secondary malignant neoplasm of bone: Secondary | ICD-10-CM

## 2013-03-21 DIAGNOSIS — C50919 Malignant neoplasm of unspecified site of unspecified female breast: Secondary | ICD-10-CM

## 2013-03-21 DIAGNOSIS — C7952 Secondary malignant neoplasm of bone marrow: Principal | ICD-10-CM

## 2013-03-21 MED ORDER — ZOLEDRONIC ACID 4 MG/100ML IV SOLN
4.0000 mg | Freq: Once | INTRAVENOUS | Status: AC
  Administered 2013-03-21: 4 mg via INTRAVENOUS
  Filled 2013-03-21: qty 100

## 2013-03-21 NOTE — Progress Notes (Signed)
  Radiation Oncology         [phone] ________________________________  Name: Cindy Frazier MRN: [id number]  Date: 03/21/2013  DOB: 1950-04-07  Stereotactic Body Radiotherapy Treatment Procedure Note  CURRENT FRACTION:    3  PLANNED FRACTIONS:  5  NARRATIVE:  Cindy Frazier was brought to the stereotactic radiation treatment machine and placed supine on the CT couch. The patient was set up for stereotactic body radiotherapy on the body fix pillow.  3D TREATMENT PLANNING AND DOSIMETRY:  The patient's radiation plan was reviewed and approved prior to starting treatment.  It showed 3-dimensional radiation distributions overlaid onto the planning CT.  The [facility] for the target structures as well as the organs at risk were reviewed. The documentation of this is filed in the radiation oncology EMR.  SIMULATION VERIFICATION:  The patient underwent CT imaging on the treatment unit.  These were carefully aligned to document that the ablative radiation dose would cover the target volume and maximally spare the nearby organs at risk according to the planned distribution.  SPECIAL TREATMENT PROCEDURE: Cindy Frazier received high dose ablative stereotactic body radiotherapy to the planned target volume without unforeseen complications. Treatment was delivered uneventfully. The high doses associated with stereotactic body radiotherapy and the significant potential risks require careful treatment set up and patient monitoring constituting a special treatment procedure   STEREOTACTIC TREATMENT MANAGEMENT:  Following delivery, the patient was evaluated clinically. The patient tolerated treatment without significant acute effects, and was discharged to home in stable condition.    PLAN: Continue treatment as planned.  ________________________________  Sheral Apley. Tammi Klippel, M.D.

## 2013-03-21 NOTE — Progress Notes (Signed)
Denies pain at rest. Reports when she ambulates with her crutches the pain in her right hip increases to a 2. Confirms that her overall pain level has decreased. Reports she occasionally has to take advil pm to get comfortable enough to sleep. Concerned about tingling in right arm. Denies pain in right arm. No fever felt in right arm nor edema noted. Has script for arimidex but, understands not to start it until after radiation is complete. Zometa infusion to follow PUT.

## 2013-03-21 NOTE — Progress Notes (Signed)
  Radiation Oncology         [phone] ________________________________  Name: Cindy Frazier MRN: [id number]  Date: 03/19/2013  DOB: 1950/03/09  Stereotactic Body Radiotherapy Treatment Procedure Note  CURRENT FRACTION:    2  PLANNED FRACTIONS:  5  NARRATIVE:  Cindy Frazier was brought to the stereotactic radiation treatment machine and placed supine on the CT couch. The patient was set up for stereotactic body radiotherapy on the body fix pillow.  3D TREATMENT PLANNING AND DOSIMETRY:  The patient's radiation plan was reviewed and approved prior to starting treatment.  It showed 3-dimensional radiation distributions overlaid onto the planning CT.  The [facility] for the target structures as well as the organs at risk were reviewed. The documentation of this is filed in the radiation oncology EMR.  SIMULATION VERIFICATION:  The patient underwent CT imaging on the treatment unit.  These were carefully aligned to document that the ablative radiation dose would cover the target volume and maximally spare the nearby organs at risk according to the planned distribution.  SPECIAL TREATMENT PROCEDURE: Cindy Frazier received high dose ablative stereotactic body radiotherapy to the planned target volume without unforeseen complications. Treatment was delivered uneventfully. The high doses associated with stereotactic body radiotherapy and the significant potential risks require careful treatment set up and patient monitoring constituting a special treatment procedure   STEREOTACTIC TREATMENT MANAGEMENT:  Following delivery, the patient was evaluated clinically. The patient tolerated treatment without significant acute effects, and was discharged to home in stable condition.    PLAN: Continue treatment as planned.  ------------------------------------------------  Jodelle Gross, MD, PhD

## 2013-03-21 NOTE — Patient Instructions (Signed)

## 2013-03-21 NOTE — Progress Notes (Signed)
  Radiation Oncology         [phone] ________________________________  Name: Cindy Frazier MRN: [id number]  Date: 03/21/2013  DOB: 1950/08/01  Stereotactic Body Radiation Therapy Management  Current Dose: 30 Gy     Planned Dose:  50 Gy  Narrative . . . . . . . . The patient presents for routine under treatment assessment.                                   The patient is without complaint.                                 Set-up films were reviewed.                                 The chart was checked. Physical Findings. . . . Weight essentially stable.  No significant changes. Impression . . . . . . . The patient is tolerating radiation. Plan . . . . . . . . . . . . Continue treatment as planned.  ________________________________  Sheral Apley. Tammi Klippel, M.D.

## 2013-03-22 ENCOUNTER — Encounter: Payer: Self-pay | Admitting: Radiation Oncology

## 2013-03-24 ENCOUNTER — Encounter: Payer: Self-pay | Admitting: Radiation Oncology

## 2013-03-24 ENCOUNTER — Ambulatory Visit
Admission: RE | Admit: 2013-03-24 | Discharge: 2013-03-24 | Disposition: A | Discharge status: Home / Self Care | Payer: BC Managed Care – PPO | Source: Ambulatory Visit | Attending: Radiation Oncology | Admitting: Radiation Oncology

## 2013-03-24 ENCOUNTER — Telehealth: Payer: Self-pay | Admitting: *Deleted

## 2013-03-24 DIAGNOSIS — C7951 Secondary malignant neoplasm of bone: Secondary | ICD-10-CM

## 2013-03-24 DIAGNOSIS — C7952 Secondary malignant neoplasm of bone marrow: Principal | ICD-10-CM

## 2013-03-24 NOTE — Telephone Encounter (Signed)
Mild arthralgias from Zometa and fatigue for 2 days-resolved. Asking if OK to take Ibuprofen prn-confirmed OK to take on occasion. Instructed her to push po fluids and be sure to let dentist know that she is on Zometa now. Confirmed she will be getting this every 3-6 months and to discuss frequency with MD at next visit.

## 2013-03-24 NOTE — Progress Notes (Signed)
  Radiation Oncology         [phone] ________________________________  Name: AZALYA GALYON MRN: [id number]  Date: 03/24/2013  DOB: 1950/05/30  Stereotactic Body Radiotherapy Treatment Procedure Note  CURRENT FRACTION:    4  PLANNED FRACTIONS:  5  NARRATIVE:  Cindy Frazier was brought to the stereotactic radiation treatment machine and placed supine on the CT couch. The patient was set up for stereotactic body radiotherapy on the body fix pillow.  3D TREATMENT PLANNING AND DOSIMETRY:  The patient's radiation plan was reviewed and approved prior to starting treatment.  It showed 3-dimensional radiation distributions overlaid onto the planning CT.  The [facility] for the target structures as well as the organs at risk were reviewed. The documentation of this is filed in the radiation oncology EMR.  SIMULATION VERIFICATION:  The patient underwent CT imaging on the treatment unit.  These were carefully aligned to document that the ablative radiation dose would cover the target volume and maximally spare the nearby organs at risk according to the planned distribution.  SPECIAL TREATMENT PROCEDURE: Winifred Olive Haymaker received high dose ablative stereotactic body radiotherapy to the planned target volume without unforeseen complications. Treatment was delivered uneventfully. The high doses associated with stereotactic body radiotherapy and the significant potential risks require careful treatment set up and patient monitoring constituting a special treatment procedure   STEREOTACTIC TREATMENT MANAGEMENT:  Following delivery, the patient was evaluated clinically. The patient tolerated treatment without significant acute effects, and was discharged to home in stable condition.    PLAN: Continue treatment as planned.  ________________________________  Sheral Apley. Tammi Klippel, M.D.

## 2013-03-26 ENCOUNTER — Encounter: Payer: Self-pay | Admitting: Radiation Oncology

## 2013-03-26 ENCOUNTER — Ambulatory Visit
Admission: RE | Admit: 2013-03-26 | Discharge: 2013-03-26 | Disposition: A | Discharge status: Home / Self Care | Payer: BC Managed Care – PPO | Source: Ambulatory Visit | Attending: Radiation Oncology | Admitting: Radiation Oncology

## 2013-03-27 ENCOUNTER — Encounter: Payer: Self-pay | Admitting: Radiation Oncology

## 2013-03-27 NOTE — Progress Notes (Signed)
  Radiation Oncology         [phone] ________________________________  Name: Cindy Frazier MRN: [id number]  Date: 03/27/2013  DOB: 11-30-1950  Stereotactic Body Radiotherapy Treatment Procedure Note  NARRATIVE:  Cindy Frazier was brought to the stereotactic radiation treatment machine and placed supine on the CT couch. The Cindy Frazier was set up for stereotactic body radiotherapy on the body fix pillow.  3D TREATMENT PLANNING AND DOSIMETRY:  The Cindy Frazier's radiation plan was reviewed and approved prior to starting treatment.  It showed 3-dimensional radiation distributions overlaid onto the planning CT.  The [facility] for the target structures as well as the organs at risk were reviewed. The documentation of this is filed in the radiation oncology EMR.  SIMULATION VERIFICATION:  The Cindy Frazier underwent CT imaging on the treatment unit.  These were carefully aligned to document that the ablative radiation dose would cover the right acetabulum target volume and maximally spare the nearby organs at risk according to the planned distribution.  SPECIAL TREATMENT PROCEDURE: Cindy Frazier received high dose ablative stereotactic body radiotherapy to the planned target volume without unforeseen complications. Treatment was delivered uneventfully. Cindy Frazier received a final 1000 cGy to the planning target volume for a cumulative dose of 5000 cGy in 5 sessions. The high doses associated with stereotactic body radiotherapy and the significant potential risks require careful treatment set up and Cindy Frazier monitoring constituting a special treatment procedure   STEREOTACTIC TREATMENT MANAGEMENT:  Following delivery, the Cindy Frazier was evaluated clinically. The Cindy Frazier tolerated treatment without significant acute effects, and was discharged to home in stable condition.    PLAN: Continue treatment as planned.  ------------------------------------------------        Rexene Edison, MD

## 2013-03-31 NOTE — Progress Notes (Signed)
  Radiation Oncology         [phone] ________________________________  Name: Cindy Frazier MRN: [id number]  Date: 03/26/2013  DOB: February 03, 1950  End of Treatment Note  Diagnosis:  63 yo woman with a solitary acetabular metastasis from ER positive breast cancer with a 15 year disease free interval  Indication for treatment:  Palliation       Radiation treatment dates:  03/17/2013, 03/19/2013, 03/21/2013, 03/24/2013, 03/26/2013  Site/dose:   The right acetabular metastasis was treated to 50 gray in 5 fractions of 10 gray  Beams/energy:   The patient was treated using 3-D conformal stereotactic body radiotherapy. 2 volumetric ARC therapy beams were employed using 10 megavolt photons in the flattening filter free beam mode. A body fix immobilization device was used for patient positioning with daily cone beam CT prior to treatment  Narrative: The patient tolerated radiation treatment relatively well.   No acute complications occurred  Plan: The patient has completed radiation treatment. The patient will return to radiation oncology clinic for routine followup in one month. I advised them to call or return sooner if they have any questions or concerns related to their recovery or treatment. ________________________________  Sheral Apley. Tammi Klippel, M.D.

## 2013-04-04 ENCOUNTER — Ambulatory Visit (HOSPITAL_BASED_OUTPATIENT_CLINIC_OR_DEPARTMENT_OTHER): Payer: BC Managed Care – PPO | Admitting: Oncology

## 2013-04-04 ENCOUNTER — Telehealth: Payer: Self-pay | Admitting: Oncology

## 2013-04-04 VITALS — BP 150/71 | HR 94 | Temp 98.6°F | Resp 18 | Ht 62.0 in | Wt 142.6 lb

## 2013-04-04 DIAGNOSIS — Z853 Personal history of malignant neoplasm of breast: Secondary | ICD-10-CM

## 2013-04-04 DIAGNOSIS — C7951 Secondary malignant neoplasm of bone: Secondary | ICD-10-CM

## 2013-04-04 DIAGNOSIS — C7952 Secondary malignant neoplasm of bone marrow: Principal | ICD-10-CM

## 2013-04-04 NOTE — Progress Notes (Signed)
  [facility] OFFICE PROGRESS NOTE   Diagnosis: Metastatic breast cancer  INTERVAL HISTORY:   Cindy Frazier completed radiation to the right acetabulum 03/26/2013. She reports improvement in the right hip discomfort. She continues to have some discomfort with weightbearing. She received Zometa 03/21/2013. She had "aching "and anorexia for one to 2 days after the Zometa. She feels well at present.  Objective:  Vital signs in last 24 hours:  Blood pressure 150/71, pulse 94, temperature 98.6 F (37 C), temperature source Oral, resp. rate 18, height $RemoveBe'5\' 2"'[id number]$  (1.575 m), weight 142 lb 9.6 oz (64.683 kg), SpO2 97.00%.    HEENT: Neck without mass Resp: Lungs clear bilaterally Cardio: Regular rate and rhythm GI: No hepatomegaly Vascular: No leg edema Musculoskeletal: Mild discomfort with internal rotation of the right hip.    Medications: I have reviewed the patient's current medications.  Assessment/Plan: 1. Breast cancer-stage IIA (T2 N0) right breast cancer diagnosed in January 2000, status post a right mastectomy and TRAM reconstruction. She completed adjuvant chemotherapy followed by 5 years of tamoxifen and then 5 years of Femara. She completed Femara at the end of July 2010.  2. Right groin pain with an MRI and bone scan February 2015 concerning for metastatic disease involving the right acetabulum/ileum.  Cycle 1 Zometa 03/21/2013  Status post SBRT to the right acetabulum completed 03/26/2013  Initiation of Arimidex 03/27/2013 3. Status postCT guided right superior acetabular bone biopsy on 02/28/2013. Pathology showed metastatic carcinoma consistent with breast primary; ER positive (90-100%); PR weakly positive (5-10%); HER-2/neu not amplified; Ki-67 20%. 4. CT chest/abdomen/pelvis 02/28/2013 with mild thyroid enlargement and multiple low-attenuation lesions probably due to multinodular goiter; suggestion of asymmetry in the deep lateral central left breast;  cholelithiasis; small left renal cysts; degenerative changes throughout the spine; no lymphadenopathy in the abdomen or pelvis; no concerning solid organ lesions; mixed lytic and sclerotic destructive lesion of the right acetabulum.  Disposition:  Cindy Frazier completed radiation to the right acetabulum. She is now maintained on Arimidex. The plan is to continue every 3 month Zometa. She will see Dr. Leonides Schanz in 2 months for a repeat x-ray evaluation of the right hip. Cindy Frazier will return for an office visit and Zometa on 06/20/2013.  Betsy Coder, MD  04/04/2013  12:11 PM

## 2013-04-04 NOTE — Telephone Encounter (Signed)
gv adn printed appt sched and avs for pt for June °

## 2013-05-08 ENCOUNTER — Ambulatory Visit
Admission: RE | Admit: 2013-05-08 | Discharge: 2013-05-08 | Disposition: A | Discharge status: Home / Self Care | Payer: BC Managed Care – PPO | Source: Ambulatory Visit | Attending: Radiation Oncology | Admitting: Radiation Oncology

## 2013-05-08 ENCOUNTER — Encounter: Payer: Self-pay | Admitting: Radiation Oncology

## 2013-05-08 VITALS — BP 127/75 | HR 96 | Resp 16 | Wt 145.8 lb

## 2013-05-08 DIAGNOSIS — C7951 Secondary malignant neoplasm of bone: Secondary | ICD-10-CM

## 2013-05-08 DIAGNOSIS — C7952 Secondary malignant neoplasm of bone marrow: Principal | ICD-10-CM

## 2013-05-08 NOTE — Progress Notes (Signed)
  Radiation Oncology         [phone] ________________________________  Name: Cindy Frazier MRN: [id number]  Date: 05/08/2013  DOB: 10-May-1950  Follow-Up Visit Note  CC: Osborne Casco, MD  Ladell Pier, MD  Diagnosis:   63 yo woman with a solitary acetabular metastasis from ER positive breast cancer with a 15 year disease free interval status post stereotactic body radiotherapy 03/17/2013, 03/19/2013, 03/21/2013, 03/24/2013, 03/26/2013 to 50 gray in 5 fractions   Interval Since Last Radiation:  6  weeks  Narrative:  The patient returns today for routine follow-up.  The patient did not experience any initial improvement in her hip pain. However, after 4-5 weeks, the pain began to improve. Now she is able to their weight with limited discomfort.                              ALLERGIES:  is allergic to erythromycin.  Meds: Current Outpatient Prescriptions  Medication Sig Dispense Refill  . anastrozole (ARIMIDEX) 1 MG tablet Take 1 tablet (1 mg total) by mouth daily.  30 tablet  5  . Calcium Carbonate-Vitamin D (CALCIUM + D PO) Take 1 tablet by mouth daily.      . CRESTOR 20 MG tablet Take 20 mg by mouth Daily.      Marland Kitchen ibuprofen (ADVIL,MOTRIN) 200 MG tablet Take 400 mg by mouth every 6 (six) hours as needed.      . INVOKANA 300 MG TABS Take 300 mg by mouth daily.      Marland Kitchen JANUMET XR 50-1000 MG TB24 Take 2 tablets by mouth daily.       Marland Kitchen losartan-hydrochlorothiazide (HYZAAR) 100-25 MG per tablet Take 1 tablet by mouth Daily.       No current facility-administered medications for this encounter.    Physical Findings: The patient is in no acute distress. Patient is alert and oriented.  weight is 145 lb 12.8 oz (66.134 kg). Her blood pressure is 127/75 and her pulse is 96. Her respiration is 16. . Ambulation is unremarkable. No significant changes.  Lab Findings: Lab Results  Component Value Date   WBC 7.2 02/21/2013   HGB 13.8 02/21/2013   HCT 43.9 02/21/2013   MCV 83.5  02/21/2013   PLT 291 02/21/2013    Impression:  The patient is recovering from the effects of radiation.  She experienced delayed improvement in hip pain, likely related to bone destruction in the acetabulum which may be beginning to repair with time and Zometa.  Plan:  The patient will followup with orthopedic oncology later in May. It is hoped that increasing stability in the hip will help her avoid hip replacement. _____________________________________  Sheral Apley. Tammi Klippel, M.D.

## 2013-05-08 NOTE — Progress Notes (Signed)
Follow up with Dr. Benay Spice scheduled for 06/20/2013 and zometa. Reports hot flashes and thinning hair related to effects of arimidex. Denies numbness or tingling in right leg. Reports her right buttock get occasionally sore from compensating during ambulation. Denies pain today. Overall "things are gradually improving." Reports it took 5-6 weeks to experience pain relief following completion of radiation therapy. Walks today with the aid of one crutch but, reports she doesn't rely on crutch at home. 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 Denies headache, dizziness, nausea or vomiting.

## 2013-06-05 ENCOUNTER — Other Ambulatory Visit: Payer: Self-pay | Admitting: Internal Medicine

## 2013-06-05 DIAGNOSIS — E042 Nontoxic multinodular goiter: Secondary | ICD-10-CM

## 2013-06-12 ENCOUNTER — Telehealth: Payer: Self-pay | Admitting: *Deleted

## 2013-06-12 NOTE — Telephone Encounter (Signed)
Call from patient reporting she has a copy of her latest hip Xray on disk. Asks if she should drop it off prior to office visit. Instructed her to bring to office visit. She voiced understanding. Noted report is available in care everywhere.

## 2013-06-20 ENCOUNTER — Other Ambulatory Visit (HOSPITAL_BASED_OUTPATIENT_CLINIC_OR_DEPARTMENT_OTHER): Payer: BC Managed Care – PPO

## 2013-06-20 ENCOUNTER — Ambulatory Visit (HOSPITAL_BASED_OUTPATIENT_CLINIC_OR_DEPARTMENT_OTHER): Payer: BC Managed Care – PPO | Admitting: Oncology

## 2013-06-20 ENCOUNTER — Ambulatory Visit (HOSPITAL_BASED_OUTPATIENT_CLINIC_OR_DEPARTMENT_OTHER): Payer: BC Managed Care – PPO

## 2013-06-20 VITALS — BP 144/67 | HR 81 | Temp 98.1°F | Resp 18 | Ht 62.0 in | Wt 145.6 lb

## 2013-06-20 DIAGNOSIS — C7951 Secondary malignant neoplasm of bone: Secondary | ICD-10-CM

## 2013-06-20 DIAGNOSIS — C50919 Malignant neoplasm of unspecified site of unspecified female breast: Secondary | ICD-10-CM

## 2013-06-20 DIAGNOSIS — C7952 Secondary malignant neoplasm of bone marrow: Principal | ICD-10-CM

## 2013-06-20 LAB — COMPREHENSIVE METABOLIC PANEL (CC13)
ALBUMIN: 3.9 g/dL (ref 3.5–5.0)
ALT: 16 U/L (ref 0–55)
ANION GAP: 10 meq/L (ref 3–11)
AST: 12 U/L (ref 5–34)
Alkaline Phosphatase: 64 U/L (ref 40–150)
BUN: 17.6 mg/dL (ref 7.0–26.0)
CALCIUM: 10.1 mg/dL (ref 8.4–10.4)
CO2: 29 meq/L (ref 22–29)
CREATININE: 0.8 mg/dL (ref 0.6–1.1)
Chloride: 100 mEq/L (ref 98–109)
Glucose: 154 mg/dl — ABNORMAL HIGH (ref 70–140)
Potassium: 3.7 mEq/L (ref 3.5–5.1)
Sodium: 140 mEq/L (ref 136–145)
Total Bilirubin: 0.47 mg/dL (ref 0.20–1.20)
Total Protein: 7.5 g/dL (ref 6.4–8.3)

## 2013-06-20 MED ORDER — SODIUM CHLORIDE 0.9 % IV SOLN
Freq: Once | INTRAVENOUS | Status: AC
  Administered 2013-06-20: 11:00:00 via INTRAVENOUS

## 2013-06-20 MED ORDER — ZOLEDRONIC ACID 4 MG/100ML IV SOLN
4.0000 mg | Freq: Once | INTRAVENOUS | Status: AC
  Administered 2013-06-20: 4 mg via INTRAVENOUS
  Filled 2013-06-20: qty 100

## 2013-06-20 NOTE — Progress Notes (Signed)
Received CBC/Cmet results from Dr. Jonnie Finner Bmet here.

## 2013-06-20 NOTE — Patient Instructions (Signed)

## 2013-06-20 NOTE — Progress Notes (Signed)
  [facility] OFFICE PROGRESS NOTE   Diagnosis: Breast cancer  INTERVAL HISTORY:   Cindy Frazier returns as scheduled. She continues Arimidex. She was treated with Zometa on 03/21/2013. No tooth pain or loosening.  She reports hair thinning since beginning Arimidex. Mild hot flashes. No arthralgias. The discomfort at the right "hip "is much improved. She has mild discomfort at the right posterior lateral thigh intermittently.  She saw Cindy Frazier on 06/04/2013. A plain x-ray of the pelvis revealed no fracture. Mixed lucency and sclerosis of the right acetabulum was noted. She reports no specific treatment was recommended.  Objective:  Vital signs in last 24 hours:  Blood pressure 144/67, pulse 81, temperature 98.1 F (36.7 C), temperature source Oral, resp. rate 18, height $RemoveBe'5\' 2"'[id number]$  (1.575 m), weight 145 lb 9.6 oz (66.044 kg).    HEENT: Neck without mass Lymphatics: No cervical, supraclavicular, or axillary nodes Resp: Lungs clear bilaterally Cardio: Regular rate and rhythm GI: No hepatosplenomegaly Vascular: No leg edema Musculoskeletal: No significant discomfort with motion at the right hip. No tenderness at the right trochanter or right posterior lateral thigh. No mass. No tenderness at the right ischium.     Lab Results:  From [facility] physicians on 06/09/2013-hemoglobin 14.9, weight was 318,000, creatinine 0.7, alkaline phosphatase 62 Calcium 10.9, albumin 4.6  Imaging:  As per history of present illness Medications: I have reviewed the patient's current medications.  Assessment/Plan: 1. Breast cancer-stage IIA (T2 N0) right breast cancer diagnosed in January 2000, status post a right mastectomy and TRAM reconstruction. She completed adjuvant chemotherapy followed by 5 years of tamoxifen and then 5 years of Femara. She completed Femara at the end of July 2010.  2. Right groin pain with an MRI and bone scan February 2015 concerning for metastatic disease involving the  right acetabulum/ileum. Cycle 1 Zometa 03/21/2013 Status post SBRT to the right acetabulum completed 03/26/2013  Initiation of Arimidex 03/27/2013 3. Status postCT guided right superior acetabular bone biopsy on 02/28/2013. Pathology showed metastatic carcinoma consistent with breast primary; ER positive (90-100%); PR weakly positive (5-10%); HER-2/neu not amplified; Ki-67 20%. 4. CT chest/abdomen/pelvis 02/28/2013 with mild thyroid enlargement and multiple low-attenuation lesions probably due to multinodular goiter; suggestion of asymmetry in the deep lateral central left breast; cholelithiasis; small left renal cysts; degenerative changes throughout the spine; no lymphadenopathy in the abdomen or pelvis; no concerning solid organ lesions; mixed lytic and sclerotic destructive lesion of the right acetabulum.  Disposition:  Her overall status appears unchanged. There is no clinical evidence for progression of the breast cancer. She will continue Arimidex and every three-month Zometa.  She will be scheduled for a restaging bone scan and office visit in 3 months. Cindy Frazier will contact us for increased pain at the right thigh and we will proceed with further x-ray evaluation.  The calcium was mildly elevated at [place] [facility] on 06/09/2013. This is likely a benign finding, potentially related to her calcium supplement. We will repeat a calcium level today.  Cindy Coder, MD  06/20/2013  9:54 AM

## 2013-06-20 NOTE — Addendum Note (Signed)
Addended by: Tania Ade on: 06/20/2013 10:42 AM   Modules accepted: Orders

## 2013-06-20 NOTE — Progress Notes (Signed)
After review of labs from PCP, Dr. Benay Spice has ordered stat Cmet to follow up on calcium and to have albumin result also. OK to give Zometa based on PCP's labs.

## 2013-06-25 ENCOUNTER — Telehealth: Payer: Self-pay | Admitting: Oncology

## 2013-06-25 NOTE — Telephone Encounter (Signed)
lvm for pt regarding to Sept appt....mailed pt appt sched/avs and letter °

## 2013-07-03 ENCOUNTER — Telehealth: Payer: Self-pay | Admitting: Oncology

## 2013-07-03 NOTE — Telephone Encounter (Signed)
called to get zometa sced Q59mths...done pt aware of d.t

## 2013-07-07 ENCOUNTER — Ambulatory Visit
Admission: RE | Admit: 2013-07-07 | Discharge: 2013-07-07 | Disposition: A | Discharge status: Home / Self Care | Payer: BC Managed Care – PPO | Source: Ambulatory Visit | Attending: Internal Medicine | Admitting: Internal Medicine

## 2013-07-07 DIAGNOSIS — E042 Nontoxic multinodular goiter: Secondary | ICD-10-CM

## 2013-08-06 ENCOUNTER — Telehealth: Payer: Self-pay | Admitting: *Deleted

## 2013-08-06 NOTE — Telephone Encounter (Signed)
Call from pt requesting to move Sept office and Zometa appt to the afternoon. Starting a new part time job, will not be able to keep AM appt. Request sent to schedulers to move appt.

## 2013-08-11 ENCOUNTER — Telehealth: Payer: Self-pay | Admitting: Nurse Practitioner

## 2013-08-11 NOTE — Telephone Encounter (Signed)
Per 07/29 POF, pt req afternoon apt, mailed new copy.Marland Kitchen..KJ

## 2013-09-18 ENCOUNTER — Other Ambulatory Visit: Payer: Self-pay | Admitting: *Deleted

## 2013-09-18 DIAGNOSIS — C7951 Secondary malignant neoplasm of bone: Principal | ICD-10-CM

## 2013-09-18 DIAGNOSIS — C50919 Malignant neoplasm of unspecified site of unspecified female breast: Secondary | ICD-10-CM

## 2013-09-18 MED ORDER — ANASTROZOLE 1 MG PO TABS: 1.0000 mg | ORAL_TABLET | Freq: Every day | ORAL | Status: DC

## 2013-09-23 ENCOUNTER — Encounter (HOSPITAL_COMMUNITY)
Admission: RE | Admit: 2013-09-23 | Discharge: 2013-09-23 | Disposition: A | Discharge status: Home / Self Care | Payer: BC Managed Care – PPO | Source: Ambulatory Visit | Attending: Oncology | Admitting: Oncology

## 2013-09-23 ENCOUNTER — Other Ambulatory Visit (HOSPITAL_BASED_OUTPATIENT_CLINIC_OR_DEPARTMENT_OTHER): Payer: BC Managed Care – PPO

## 2013-09-23 ENCOUNTER — Ambulatory Visit (HOSPITAL_COMMUNITY)
Admission: RE | Admit: 2013-09-23 | Discharge: 2013-09-23 | Disposition: A | Discharge status: Home / Self Care | Payer: BC Managed Care – PPO | Source: Ambulatory Visit | Attending: Oncology | Admitting: Oncology

## 2013-09-23 DIAGNOSIS — C7951 Secondary malignant neoplasm of bone: Secondary | ICD-10-CM | POA: Insufficient documentation

## 2013-09-23 DIAGNOSIS — C50919 Malignant neoplasm of unspecified site of unspecified female breast: Secondary | ICD-10-CM

## 2013-09-23 DIAGNOSIS — C7952 Secondary malignant neoplasm of bone marrow: Principal | ICD-10-CM

## 2013-09-23 LAB — BASIC METABOLIC PANEL (CC13)
ANION GAP: 9 meq/L (ref 3–11)
BUN: 18.3 mg/dL (ref 7.0–26.0)
CHLORIDE: 101 meq/L (ref 98–109)
CO2: 29 mEq/L (ref 22–29)
CREATININE: 0.7 mg/dL (ref 0.6–1.1)
Calcium: 9.8 mg/dL (ref 8.4–10.4)
Glucose: 139 mg/dl (ref 70–140)
Potassium: 4.4 mEq/L (ref 3.5–5.1)
Sodium: 139 mEq/L (ref 136–145)

## 2013-09-23 MED ORDER — TECHNETIUM TC 99M MEDRONATE IV KIT
26.1000 | PACK | Freq: Once | INTRAVENOUS | Status: AC | PRN
  Administered 2013-09-23: 26.1 via INTRAVENOUS

## 2013-09-23 MED ORDER — FLUDEOXYGLUCOSE F - 18 (FDG) INJECTION: 26.1000 | Freq: Once | INTRAVENOUS | Status: DC | PRN

## 2013-09-25 ENCOUNTER — Telehealth: Payer: Self-pay | Admitting: *Deleted

## 2013-09-25 NOTE — Telephone Encounter (Signed)
Called and informed patient that bone scan has improved and to follow up as scheduled.  Per Dr. Benay Spice.  Patient verbalized understanding.

## 2013-09-25 NOTE — Telephone Encounter (Signed)
Message copied by Wardell Heath on Thu Sep 25, 2013  9:06 AM ------      Message from: Betsy Coder B      Created: Wed Sep 24, 2013  7:56 PM       Please call patient, bone scan is improved, f/u as scheduled ------

## 2013-09-29 ENCOUNTER — Telehealth: Payer: Self-pay | Admitting: *Deleted

## 2013-09-29 ENCOUNTER — Ambulatory Visit (HOSPITAL_BASED_OUTPATIENT_CLINIC_OR_DEPARTMENT_OTHER): Payer: BC Managed Care – PPO | Admitting: Nurse Practitioner

## 2013-09-29 ENCOUNTER — Ambulatory Visit: Payer: BC Managed Care – PPO | Admitting: Oncology

## 2013-09-29 ENCOUNTER — Ambulatory Visit (HOSPITAL_BASED_OUTPATIENT_CLINIC_OR_DEPARTMENT_OTHER): Payer: BC Managed Care – PPO

## 2013-09-29 ENCOUNTER — Ambulatory Visit: Payer: BC Managed Care – PPO

## 2013-09-29 ENCOUNTER — Telehealth: Payer: Self-pay | Admitting: Nurse Practitioner

## 2013-09-29 VITALS — BP 158/71 | HR 89 | Temp 98.5°F | Resp 20 | Ht 62.0 in | Wt 147.1 lb

## 2013-09-29 DIAGNOSIS — C7951 Secondary malignant neoplasm of bone: Secondary | ICD-10-CM

## 2013-09-29 DIAGNOSIS — C50919 Malignant neoplasm of unspecified site of unspecified female breast: Secondary | ICD-10-CM

## 2013-09-29 DIAGNOSIS — C7952 Secondary malignant neoplasm of bone marrow: Secondary | ICD-10-CM

## 2013-09-29 MED ORDER — ZOLEDRONIC ACID 4 MG/100ML IV SOLN
4.0000 mg | Freq: Once | INTRAVENOUS | Status: AC
  Administered 2013-09-29: 4 mg via INTRAVENOUS
  Filled 2013-09-29: qty 100

## 2013-09-29 MED ORDER — SODIUM CHLORIDE 0.9 % IV SOLN
Freq: Once | INTRAVENOUS | Status: AC
  Administered 2013-09-29: 16:00:00 via INTRAVENOUS

## 2013-09-29 MED ORDER — ANASTROZOLE 1 MG PO TABS: 1.0000 mg | ORAL_TABLET | Freq: Every day | ORAL | Status: DC

## 2013-09-29 NOTE — Progress Notes (Addendum)
  [facility] OFFICE PROGRESS NOTE   Diagnosis:  Breast cancer  INTERVAL HISTORY:   Cindy Frazier returns as scheduled. She continues Arimidex. She was treated with Zometa on 06/20/2013. She continues to note thinning of her hair. She continues to have "inconsistent" discomfort involving the right hip. No other areas of pain. She has occasional hot flashes. Appetite is good. She denies shortness of breath.  Objective:  Vital signs in last 24 hours:  Blood pressure 158/71, pulse 89, temperature 98.5 F (36.9 C), temperature source Oral, resp. rate 20, height $RemoveBe'5\' 2"'VRhkAhprX$  (1.575 m), weight 147 lb 1.6 oz (66.724 kg).    HEENT: No thrush or ulcers. Lymphatics: No palpable cervical, supraclavicular or axillary lymph nodes. Resp: Lungs clear bilaterally. Cardio: Regular rate and rhythm. GI: Abdomen soft and nontender. No hepatomegaly. Vascular: No leg edema.   Lab Results:  Lab Results  Component Value Date   WBC 7.2 02/21/2013   HGB 13.8 02/21/2013   HCT 43.9 02/21/2013   MCV 83.5 02/21/2013   PLT 291 02/21/2013   NEUTROABS 4.2 02/21/2013    Imaging:  No results found.  Medications: I have reviewed the patient's current medications.  Assessment/Plan: 1. Breast cancer-stage IIA (T2 N0) right breast cancer diagnosed in January 2000, status post a right mastectomy and TRAM reconstruction. She completed adjuvant chemotherapy followed by 5 years of tamoxifen and then 5 years of Femara. She completed Femara at the end of July 2010.  2. Right groin pain with an MRI and bone scan February 2015 concerning for metastatic disease involving the right acetabulum/ileum. Cycle 1 Zometa 03/21/2013  Status post SBRT to the right acetabulum completed 03/26/2013  Initiation of Arimidex 03/27/2013. Bone scan 09/23/2013. Metastatic disease in the right hemipelvis. Overall degree of activity decreased when compared to the prior exam. 3. Status postCT guided right superior acetabular bone  biopsy on 02/28/2013. Pathology showed metastatic carcinoma consistent with breast primary; ER positive (90-100%); PR weakly positive (5-10%); HER-2/neu not amplified; Ki-67 20%. 4. CT chest/abdomen/pelvis 02/28/2013 with mild thyroid enlargement and multiple low-attenuation lesions probably due to multinodular goiter; suggestion of asymmetry in the deep lateral central left breast; cholelithiasis; small left renal cysts; degenerative changes throughout the spine; no lymphadenopathy in the abdomen or pelvis; no concerning solid organ lesions; mixed lytic and sclerotic destructive lesion of the right acetabulum.   Disposition: Cindy Frazier appears stable. The recent bone scan appears improved. She will continue Arimidex and every 3 month Zometa. We will repeat a bone scan at a 6-8 month interval.  We recommended she contact Dr. Leonides Schanz for followup regarding the discomfort involving the right hip and activity restrictions, if any.  She will return for a followup visit and Zometa in 3 months. She will contact the office in the interim with any problems.  Patient seen with Dr. Benay Spice. Bone scan images were reviewed on the computer with Cindy Frazier and her husband. 25 minutes were spent face-to-face at today's visit with the majority of the time involved in counseling/coordination of care.    [name] ANP/GNP-BC   09/29/2013  2:28 PM   This was a shared visit with [name]. Cindy Frazier appears stable. We reviewed the bone scan with her today. There is no x-ray evidence of disease progression. She will see Dr. Leonides Schanz to discuss exercising. She will continue Arimidex and Zometa.  Julieanne Manson, M.D.

## 2013-09-29 NOTE — Telephone Encounter (Signed)
Pt confirmed labs/ov per 09/21 POF, sent msg to add Zometa, gave pt AVS...Marland KitchenMarland KitchenKJ

## 2013-09-29 NOTE — Patient Instructions (Signed)

## 2013-09-29 NOTE — Telephone Encounter (Signed)
Per staff message and POF I have scheduled appts. Advised scheduler of appts. JMW  

## 2013-10-15 ENCOUNTER — Telehealth: Payer: Self-pay | Admitting: *Deleted

## 2013-10-15 ENCOUNTER — Other Ambulatory Visit: Payer: Self-pay

## 2013-10-15 DIAGNOSIS — Z1231 Encounter for screening mammogram for malignant neoplasm of breast: Secondary | ICD-10-CM

## 2013-10-15 NOTE — Telephone Encounter (Signed)
Message from pt requesting bone scan disc and report to be sent to Dr. Marquette Saa. Returned call, pt stated she will pick up disk. Report routed to Dr. Leonides Schanz via fax. Radiology notified to prepare disk for pt.

## 2013-10-29 ENCOUNTER — Ambulatory Visit
Admission: RE | Admit: 2013-10-29 | Discharge: 2013-10-29 | Disposition: A | Discharge status: Home / Self Care | Payer: BC Managed Care – PPO | Source: Ambulatory Visit

## 2013-10-29 ENCOUNTER — Other Ambulatory Visit: Payer: Self-pay

## 2013-10-29 DIAGNOSIS — Z1231 Encounter for screening mammogram for malignant neoplasm of breast: Secondary | ICD-10-CM

## 2013-12-29 ENCOUNTER — Ambulatory Visit (HOSPITAL_BASED_OUTPATIENT_CLINIC_OR_DEPARTMENT_OTHER): Payer: BC Managed Care – PPO | Admitting: Oncology

## 2013-12-29 ENCOUNTER — Telehealth: Payer: Self-pay | Admitting: Oncology

## 2013-12-29 ENCOUNTER — Ambulatory Visit (HOSPITAL_BASED_OUTPATIENT_CLINIC_OR_DEPARTMENT_OTHER): Payer: BC Managed Care – PPO | Admitting: Lab

## 2013-12-29 ENCOUNTER — Ambulatory Visit (HOSPITAL_BASED_OUTPATIENT_CLINIC_OR_DEPARTMENT_OTHER): Payer: BC Managed Care – PPO

## 2013-12-29 VITALS — BP 139/70 | HR 81 | Temp 98.2°F | Resp 18 | Ht 62.0 in | Wt 147.6 lb

## 2013-12-29 DIAGNOSIS — C50911 Malignant neoplasm of unspecified site of right female breast: Secondary | ICD-10-CM

## 2013-12-29 DIAGNOSIS — C7952 Secondary malignant neoplasm of bone marrow: Principal | ICD-10-CM

## 2013-12-29 DIAGNOSIS — C7951 Secondary malignant neoplasm of bone: Secondary | ICD-10-CM

## 2013-12-29 DIAGNOSIS — C50919 Malignant neoplasm of unspecified site of unspecified female breast: Secondary | ICD-10-CM

## 2013-12-29 DIAGNOSIS — Z170 Estrogen receptor positive status [ER+]: Secondary | ICD-10-CM

## 2013-12-29 LAB — COMPREHENSIVE METABOLIC PANEL (CC13)
ALK PHOS: 61 U/L (ref 40–150)
ALT: 19 U/L (ref 0–55)
AST: 13 U/L (ref 5–34)
Albumin: 3.7 g/dL (ref 3.5–5.0)
Anion Gap: 11 mEq/L (ref 3–11)
BILIRUBIN TOTAL: 0.33 mg/dL (ref 0.20–1.20)
BUN: 15.4 mg/dL (ref 7.0–26.0)
CO2: 26 mEq/L (ref 22–29)
Calcium: 9.4 mg/dL (ref 8.4–10.4)
Chloride: 103 mEq/L (ref 98–109)
Creatinine: 0.8 mg/dL (ref 0.6–1.1)
EGFR: 79 mL/min/{1.73_m2} — ABNORMAL LOW (ref >90)
Glucose: 210 mg/dl — ABNORMAL HIGH (ref 70–140)
Potassium: 3.7 mEq/L (ref 3.5–5.1)
SODIUM: 140 meq/L (ref 136–145)
Total Protein: 7.2 g/dL (ref 6.4–8.3)

## 2013-12-29 MED ORDER — SODIUM CHLORIDE 0.9 % IV SOLN
Freq: Once | INTRAVENOUS | Status: AC
  Administered 2013-12-29: 10:00:00 via INTRAVENOUS

## 2013-12-29 MED ORDER — ALTEPLASE 2 MG IJ SOLR
2.0000 mg | Freq: Once | INTRAMUSCULAR | Status: DC | PRN
  Filled 2013-12-29: qty 2

## 2013-12-29 MED ORDER — ZOLEDRONIC ACID 4 MG/100ML IV SOLN
4.0000 mg | Freq: Once | INTRAVENOUS | Status: AC
  Administered 2013-12-29: 4 mg via INTRAVENOUS
  Filled 2013-12-29: qty 100

## 2013-12-29 MED ORDER — HEPARIN SOD (PORK) LOCK FLUSH 100 UNIT/ML IV SOLN
500.0000 [IU] | Freq: Once | INTRAVENOUS | Status: DC | PRN
Start: 2013-12-29 — End: 2013-12-29
  Filled 2013-12-29: qty 5

## 2013-12-29 MED ORDER — HEPARIN SOD (PORK) LOCK FLUSH 100 UNIT/ML IV SOLN
250.0000 [IU] | Freq: Once | INTRAVENOUS | Status: DC | PRN
  Filled 2013-12-29: qty 5

## 2013-12-29 MED ORDER — SODIUM CHLORIDE 0.9 % IJ SOLN
3.0000 mL | Freq: Once | INTRAMUSCULAR | Status: DC | PRN
  Filled 2013-12-29: qty 10

## 2013-12-29 MED ORDER — SODIUM CHLORIDE 0.9 % IJ SOLN
10.0000 mL | INTRAMUSCULAR | Status: DC | PRN
  Filled 2013-12-29: qty 10

## 2013-12-29 NOTE — Progress Notes (Signed)
  [facility] OFFICE PROGRESS NOTE   Diagnosis: Breast cancer  INTERVAL HISTORY:   Cindy Frazier returns as scheduled. She continues a Arimidex. She saw Dr. Leonides Schanz in October. She reports an x-ray revealed healing of the right hip. No pain. She is exercising by walking. No jaw or tooth pain. No significant hot flashes or arthralgias.  Objective:  Vital signs in last 24 hours:  Blood pressure 139/70, pulse 81, temperature 98.2 F (36.8 C), temperature source Oral, resp. rate 18, height _0  (1.575 m), weight 147 lb 9.6 oz (66.951 kg), SpO2 99 %.    HEENT: Neck without mass Lymphatics: No cervical, supra-clavicular, or axillary nodes Resp: Lungs clear bilaterally Cardio: Regular rate and rhythm GI: No hepatomegaly Vascular: No leg edema    Medications: I have reviewed the patient's current medications.  Assessment/Plan: 1. Breast cancer-stage IIA (T2 N0) right breast cancer diagnosed in January 2000, status post a right mastectomy and TRAM reconstruction. She completed adjuvant chemotherapy followed by 5 years of tamoxifen and then 5 years of Femara. She completed Femara at the end of July 2010.  2. Right groin pain with an MRI and bone scan February 2015 concerning for metastatic disease involving the right acetabulum/ileum.  Cycle 1 Zometa 03/21/2013   Status post SBRT to the right acetabulum completed 03/26/2013   Initiation of Arimidex 03/27/2013.  Bone scan 09/23/2013. Metastatic disease in the right hemipelvis. Overall degree of activity decreased when compared to the prior exam. 3. Status postCT guided right superior acetabular bone biopsy on 02/28/2013. Pathology showed metastatic carcinoma consistent with breast primary; ER positive (90-100%); PR weakly positive (5-10%); HER-2/neu not amplified; Ki-67 20%. 4. CT chest/abdomen/pelvis 02/28/2013 with mild thyroid enlargement and multiple low-attenuation lesions probably due to multinodular goiter;  suggestion of asymmetry in the deep lateral central left breast; cholelithiasis; small left renal cysts; degenerative changes throughout the spine; no lymphadenopathy in the abdomen or pelvis; no concerning solid organ lesions; mixed lytic and sclerotic destructive lesion of the right acetabulum.   Disposition:  Cindy Frazier appears stable. She will continue Arimidex. She receives every 3 months Zometa. She will be scheduled for an office visit and restaging bone scan in April 2016.  Betsy Coder, MD  12/29/2013  8:53 AM

## 2013-12-29 NOTE — Telephone Encounter (Signed)
gv adn printed appt sched and avs for pt for April 2016 °

## 2013-12-29 NOTE — Patient Instructions (Signed)

## 2014-01-22 ENCOUNTER — Other Ambulatory Visit: Payer: Self-pay | Admitting: Obstetrics and Gynecology

## 2014-01-23 LAB — CYTOLOGY - PAP

## 2014-04-21 ENCOUNTER — Encounter (HOSPITAL_COMMUNITY)
Admission: RE | Admit: 2014-04-21 | Discharge: 2014-04-21 | Disposition: A | Discharge status: Home / Self Care | Payer: BC Managed Care – PPO | Source: Ambulatory Visit | Attending: Oncology | Admitting: Oncology

## 2014-04-21 ENCOUNTER — Ambulatory Visit (HOSPITAL_COMMUNITY)
Admission: RE | Admit: 2014-04-21 | Discharge: 2014-04-21 | Disposition: A | Discharge status: Home / Self Care | Payer: BC Managed Care – PPO | Source: Ambulatory Visit | Attending: Oncology | Admitting: Oncology

## 2014-04-21 ENCOUNTER — Encounter (HOSPITAL_COMMUNITY): Payer: BC Managed Care – PPO

## 2014-04-21 DIAGNOSIS — C7951 Secondary malignant neoplasm of bone: Secondary | ICD-10-CM

## 2014-04-21 DIAGNOSIS — R938 Abnormal findings on diagnostic imaging of other specified body structures: Secondary | ICD-10-CM | POA: Diagnosis not present

## 2014-04-21 DIAGNOSIS — C50919 Malignant neoplasm of unspecified site of unspecified female breast: Secondary | ICD-10-CM | POA: Diagnosis present

## 2014-04-21 DIAGNOSIS — C7952 Secondary malignant neoplasm of bone marrow: Secondary | ICD-10-CM

## 2014-04-21 MED ORDER — TECHNETIUM TC 99M MEDRONATE IV KIT
25.0000 | PACK | Freq: Once | INTRAVENOUS | Status: AC | PRN
Start: 2014-04-21 — End: 2014-04-21

## 2014-04-24 ENCOUNTER — Other Ambulatory Visit (HOSPITAL_COMMUNITY)
Admission: RE | Admit: 2014-04-24 | Discharge: 2014-04-24 | Disposition: A | Discharge status: Home / Self Care | Payer: BC Managed Care – PPO | Source: Ambulatory Visit | Attending: Oncology | Admitting: Oncology

## 2014-04-24 ENCOUNTER — Other Ambulatory Visit: Payer: BC Managed Care – PPO

## 2014-04-24 ENCOUNTER — Telehealth: Payer: Self-pay | Admitting: Nurse Practitioner

## 2014-04-24 ENCOUNTER — Ambulatory Visit (HOSPITAL_BASED_OUTPATIENT_CLINIC_OR_DEPARTMENT_OTHER): Payer: BC Managed Care – PPO

## 2014-04-24 ENCOUNTER — Ambulatory Visit (HOSPITAL_BASED_OUTPATIENT_CLINIC_OR_DEPARTMENT_OTHER): Payer: BC Managed Care – PPO | Admitting: Nurse Practitioner

## 2014-04-24 VITALS — BP 155/72 | HR 68 | Temp 98.1°F | Resp 19 | Ht 62.0 in | Wt 150.3 lb

## 2014-04-24 DIAGNOSIS — N951 Menopausal and female climacteric states: Secondary | ICD-10-CM | POA: Diagnosis not present

## 2014-04-24 DIAGNOSIS — C50911 Malignant neoplasm of unspecified site of right female breast: Secondary | ICD-10-CM

## 2014-04-24 DIAGNOSIS — C7951 Secondary malignant neoplasm of bone: Secondary | ICD-10-CM

## 2014-04-24 DIAGNOSIS — C50919 Malignant neoplasm of unspecified site of unspecified female breast: Secondary | ICD-10-CM | POA: Insufficient documentation

## 2014-04-24 DIAGNOSIS — C7952 Secondary malignant neoplasm of bone marrow: Principal | ICD-10-CM

## 2014-04-24 LAB — BASIC METABOLIC PANEL
Anion gap: 10 (ref 5–15)
BUN: 23 mg/dL (ref 6–23)
CALCIUM: 10.3 mg/dL (ref 8.4–10.5)
CO2: 31 mmol/L (ref 19–32)
CREATININE: 0.72 mg/dL (ref 0.50–1.10)
Chloride: 100 mmol/L (ref 96–112)
GFR calc Af Amer: >90 mL/min (ref >90)
GFR, EST NON AFRICAN AMERICAN: 89 mL/min — AB (ref >90)
Glucose, Bld: 178 mg/dL — ABNORMAL HIGH (ref 70–99)
Potassium: 4.4 mmol/L (ref 3.5–5.1)
Sodium: 141 mmol/L (ref 135–145)

## 2014-04-24 MED ORDER — SODIUM CHLORIDE 0.9 % IJ SOLN
10.0000 mL | INTRAMUSCULAR | Status: DC | PRN
  Filled 2014-04-24: qty 10

## 2014-04-24 MED ORDER — ZOLEDRONIC ACID 4 MG/100ML IV SOLN
4.0000 mg | Freq: Once | INTRAVENOUS | Status: AC
  Administered 2014-04-24: 4 mg via INTRAVENOUS
  Filled 2014-04-24: qty 100

## 2014-04-24 MED ORDER — SODIUM CHLORIDE 0.9 % IV SOLN
Freq: Once | INTRAVENOUS | Status: AC
  Administered 2014-04-24: 11:00:00 via INTRAVENOUS

## 2014-04-24 MED ORDER — HEPARIN SOD (PORK) LOCK FLUSH 100 UNIT/ML IV SOLN
500.0000 [IU] | Freq: Once | INTRAVENOUS | Status: DC | PRN
  Filled 2014-04-24: qty 5

## 2014-04-24 NOTE — Progress Notes (Signed)
  [place] OFFICE PROGRESS NOTE   Diagnosis:  Breast cancer  INTERVAL HISTORY:   Cindy Frazier returns as scheduled. She continues Arimidex. She feels well. No interim illnesses or infections. No significant pain. She has occasional hot flashes. No arthralgias. She denies nausea/vomiting. No mouth sores. No constipation or diarrhea. She denies any bleeding. No jaw or tooth pain. She has a good appetite.  Objective:  Vital signs in last 24 hours:  Blood pressure 155/72, pulse 68, temperature 98.1 F (36.7 C), temperature source Oral, resp. rate 19, height $RemoveBe'5\' 2"'UuxbeIrug$  (1.575 m), weight 150 lb 4.8 oz (68.176 kg), SpO2 100 %.    HEENT: No thrush or ulcers. Lymphatics: No palpable cervical, supraclavicular or axillary lymph nodes. Resp: Lungs clear bilaterally. Cardio: Regular rate and rhythm. GI: Abdomen soft and nontender. No hepatomegaly. Vascular: No leg edema. Calves soft and nontender.   Lab Results:  Lab Results  Component Value Date   WBC 7.2 02/21/2013   HGB 13.8 02/21/2013   HCT 43.9 02/21/2013   MCV 83.5 02/21/2013   PLT 291 02/21/2013   NEUTROABS 4.2 02/21/2013    Imaging:  No results found.  Medications: I have reviewed the patient's current medications.  Assessment/Plan: 1. Breast cancer-stage IIA (T2 N0) right breast cancer diagnosed in January 2000, status post a right mastectomy and TRAM reconstruction. She completed adjuvant chemotherapy followed by 5 years of tamoxifen and then 5 years of Femara. She completed Femara at the end of July 2010.  2. Right groin pain with an MRI and bone scan February 2015 concerning for metastatic disease involving the right acetabulum/ileum.  Cycle 1 Zometa 03/21/2013   Status post SBRT to the right acetabulum completed 03/26/2013   Initiation of Arimidex 03/27/2013.  Bone scan 09/23/2013. Metastatic disease in the right hemipelvis. Overall degree of activity decreased when compared to the prior  exam.  Bone scan 04/21/2014. Right pelvic/acetabular uptake slightly decreased since the prior exam. 3. Status post CT guided right superior acetabular bone biopsy on 02/28/2013. Pathology showed metastatic carcinoma consistent with breast primary; ER positive (90-100%); PR weakly positive (5-10%); HER-2/neu not amplified; Ki-67 20%. 4. CT chest/abdomen/pelvis 02/28/2013 with mild thyroid enlargement and multiple low-attenuation lesions probably due to multinodular goiter; suggestion of asymmetry in the deep lateral central left breast; cholelithiasis; small left renal cysts; degenerative changes throughout the spine; no lymphadenopathy in the abdomen or pelvis; no concerning solid organ lesions; mixed lytic and sclerotic destructive lesion of the right acetabulum.   Disposition: Cindy Frazier appears stable. The restaging bone scan showed continued improvement and no new areas of disease. She will continue Arimidex and every 3 month Zometa. Next bone scan at a six-month interval. She will return for a follow-up visit and Zometa in 3 months.   Plan reviewed with Dr. Benay Spice.    [name] ANP/GNP-BC   04/24/2014  9:54 AM

## 2014-04-24 NOTE — Progress Notes (Signed)
Per [name] NP, give pt zometa without BMet results if OK with pharmacy

## 2014-04-24 NOTE — Patient Instructions (Signed)

## 2014-04-24 NOTE — Telephone Encounter (Signed)
Appointments made and avs printed for patient °

## 2014-07-17 ENCOUNTER — Ambulatory Visit (HOSPITAL_BASED_OUTPATIENT_CLINIC_OR_DEPARTMENT_OTHER): Payer: BC Managed Care – PPO | Admitting: Oncology

## 2014-07-17 ENCOUNTER — Telehealth: Payer: Self-pay | Admitting: Oncology

## 2014-07-17 ENCOUNTER — Ambulatory Visit (HOSPITAL_BASED_OUTPATIENT_CLINIC_OR_DEPARTMENT_OTHER): Payer: BC Managed Care – PPO

## 2014-07-17 ENCOUNTER — Other Ambulatory Visit (HOSPITAL_BASED_OUTPATIENT_CLINIC_OR_DEPARTMENT_OTHER): Payer: BC Managed Care – PPO

## 2014-07-17 ENCOUNTER — Telehealth: Payer: Self-pay | Admitting: *Deleted

## 2014-07-17 VITALS — BP 137/65 | HR 73 | Temp 98.8°F | Resp 16 | Ht 62.0 in | Wt 150.7 lb

## 2014-07-17 DIAGNOSIS — C7952 Secondary malignant neoplasm of bone marrow: Principal | ICD-10-CM

## 2014-07-17 DIAGNOSIS — C7951 Secondary malignant neoplasm of bone: Secondary | ICD-10-CM

## 2014-07-17 DIAGNOSIS — C50911 Malignant neoplasm of unspecified site of right female breast: Secondary | ICD-10-CM | POA: Diagnosis not present

## 2014-07-17 DIAGNOSIS — Z853 Personal history of malignant neoplasm of breast: Secondary | ICD-10-CM

## 2014-07-17 LAB — BASIC METABOLIC PANEL (CC13)
Anion Gap: 11 mEq/L (ref 3–11)
BUN: 24.4 mg/dL (ref 7.0–26.0)
CALCIUM: 9.9 mg/dL (ref 8.4–10.4)
CHLORIDE: 103 meq/L (ref 98–109)
CO2: 26 meq/L (ref 22–29)
CREATININE: 0.8 mg/dL (ref 0.6–1.1)
EGFR: 78 mL/min/{1.73_m2} — ABNORMAL LOW (ref >90)
Glucose: 175 mg/dl — ABNORMAL HIGH (ref 70–140)
Potassium: 3.8 mEq/L (ref 3.5–5.1)
Sodium: 140 mEq/L (ref 136–145)

## 2014-07-17 MED ORDER — SODIUM CHLORIDE 0.9 % IV SOLN
Freq: Once | INTRAVENOUS | Status: AC
  Administered 2014-07-17: 11:00:00 via INTRAVENOUS

## 2014-07-17 MED ORDER — ZOLEDRONIC ACID 4 MG/100ML IV SOLN
4.0000 mg | Freq: Once | INTRAVENOUS | Status: AC
  Administered 2014-07-17: 4 mg via INTRAVENOUS
  Filled 2014-07-17: qty 100

## 2014-07-17 NOTE — Patient Instructions (Signed)

## 2014-07-17 NOTE — Telephone Encounter (Signed)
Per staff message and POF I have scheduled appts. Advised scheduler of appts. JMW  

## 2014-07-17 NOTE — Telephone Encounter (Signed)
adv pt that [facility] will call to sch scan-pt understood

## 2014-07-17 NOTE — Telephone Encounter (Signed)
per pof to sch pt appt-gave pt copy of avs-perpof 10/7-sherrill in clinic-per pt 10/6 was better-sent MW email to sch zometa

## 2014-07-17 NOTE — Progress Notes (Signed)
  [place] OFFICE PROGRESS NOTE   Diagnosis: Breast cancer  INTERVAL HISTORY:   Ms. Mumpower returns as scheduled. She continues Arimidex. She feels well. No hot flashes or arthralgias. She continues every three-month Zometa. She reports mild discomfort at a few of the low anterior right-sided teeth. No loosening. She is followed by a dentist.  No significant pain at the right hip. She has noted discomfort at the right lateral iliac region for the past few days.  Objective:  Vital signs in last 24 hours:  Blood pressure 137/65, pulse 73, temperature 98.8 F (37.1 C), temperature source Oral, resp. rate 16, height _0  (1.575 m), weight 150 lb 11.2 oz (68.357 kg), SpO2 99 %.    HEENT: Neck without mass Lymphatics: No cervical, supraclavicular, or axillary nodes Resp: Lungs clear bilaterally Cardio: Regular rate and rhythm GI: No hepatomegaly Vascular: No leg edema Breasts: Status post right mastectomy with a TRAM reconstruction. No evidence for chest wall tumor recurrence. Left breast without mass.  Musculoskeletal: No tenderness at the right iliac, no pain with motion at the right hip    Medications: I have reviewed the patient's current medications.  Assessment/Plan: 1. Breast cancer-stage IIA (T2 N0) right breast cancer diagnosed in January 2000, status post a right mastectomy and TRAM reconstruction. She completed adjuvant chemotherapy followed by 5 years of tamoxifen and then 5 years of Femara. She completed Femara at the end of July 2010.  2. Right groin pain with an MRI and bone scan February 2015 concerning for metastatic disease involving the right acetabulum/ileum.  Cycle 1 Zometa 03/21/2013   Status post SBRT to the right acetabulum completed 03/26/2013   Initiation of Arimidex 03/27/2013.  Bone scan 09/23/2013. Metastatic disease in the right hemipelvis. Overall degree of activity decreased when compared to the prior exam.  Bone scan  04/21/2014. Right pelvic/acetabular uptake slightly decreased since the prior exam. 3. Status post CT guided right superior acetabular bone biopsy on 02/28/2013. Pathology showed metastatic carcinoma consistent with breast primary; ER positive (90-100%); PR weakly positive (5-10%); HER-2/neu not amplified; Ki-67 20%. 4. CT chest/abdomen/pelvis 02/28/2013 with mild thyroid enlargement and multiple low-attenuation lesions probably due to multinodular goiter; suggestion of asymmetry in the deep lateral central left breast; cholelithiasis; small left renal cysts; degenerative changes throughout the spine; no lymphadenopathy in the abdomen or pelvis; no concerning solid organ lesions; mixed lytic and sclerotic destructive lesion of the right acetabulum.   Disposition:  There is no clinical evidence of disease progression. She will continue Arimidex. Ms. Applegate will receive Zometa today. She will undergo a restaging bone scan prior to an office visit in 3 months.  Betsy Coder, MD  07/17/2014  9:47 AM

## 2014-08-18 ENCOUNTER — Other Ambulatory Visit: Payer: Self-pay | Admitting: Gastroenterology

## 2014-09-21 ENCOUNTER — Telehealth: Payer: Self-pay | Admitting: *Deleted

## 2014-09-21 NOTE — Telephone Encounter (Signed)
Per [name], NP; instructed pt to continue to monitor right shoulder pain for now since pain is controlled with Tylenol ES and wait until after bone scan to see if anything shows up.  Instructions given if pain gets worse to call office.  Pt verbalized understanding and expressed appreciation for call back.

## 2014-09-21 NOTE — Telephone Encounter (Signed)
Pt called reports "I've been having right shoulder pain for the last week and half; not related to movement; I can dry my hair etc without any problems; my right wrist also hurt but that comes and goes"  Pt states pain is 3/4 right now but "at night it seems to hurt worse 8/9"  Pt is taking Tylenol ES with adequate pain relief.  Pt states "I can wait until I have the bone scan next week but I just wanted to let y'all know in cause I need to do something different"  Denies any other c/o at this time.  Note to [name], NP

## 2014-09-29 ENCOUNTER — Other Ambulatory Visit: Payer: Self-pay

## 2014-09-29 DIAGNOSIS — Z1231 Encounter for screening mammogram for malignant neoplasm of breast: Secondary | ICD-10-CM

## 2014-10-02 ENCOUNTER — Telehealth: Payer: Self-pay | Admitting: *Deleted

## 2014-10-02 ENCOUNTER — Encounter (HOSPITAL_COMMUNITY)
Admission: RE | Admit: 2014-10-02 | Discharge: 2014-10-02 | Disposition: A | Discharge status: Home / Self Care | Payer: BC Managed Care – PPO | Source: Ambulatory Visit | Attending: Oncology | Admitting: Oncology

## 2014-10-02 DIAGNOSIS — C7951 Secondary malignant neoplasm of bone: Secondary | ICD-10-CM | POA: Insufficient documentation

## 2014-10-02 DIAGNOSIS — C7952 Secondary malignant neoplasm of bone marrow: Secondary | ICD-10-CM

## 2014-10-02 MED ORDER — TECHNETIUM TC 99M MEDRONATE IV KIT
25.0000 | PACK | Freq: Once | INTRAVENOUS | Status: AC | PRN
  Administered 2014-10-02: 25 via INTRAVENOUS

## 2014-10-02 NOTE — Telephone Encounter (Signed)
-----   Message from Ladell Pier, MD sent at 10/02/2014  4:16 PM EDT ----- Please call patient, bone scan is stable, no evidence of progressive cancer

## 2014-10-02 NOTE — Telephone Encounter (Signed)
Per Dr. Benay Spice; notified pt that bone scan is stable, no evidence of progressive cancer.  Pt verbalized understanding and expressed appreciation

## 2014-10-15 ENCOUNTER — Ambulatory Visit (HOSPITAL_BASED_OUTPATIENT_CLINIC_OR_DEPARTMENT_OTHER): Payer: BC Managed Care – PPO

## 2014-10-15 ENCOUNTER — Other Ambulatory Visit (HOSPITAL_BASED_OUTPATIENT_CLINIC_OR_DEPARTMENT_OTHER): Payer: BC Managed Care – PPO

## 2014-10-15 ENCOUNTER — Ambulatory Visit (HOSPITAL_BASED_OUTPATIENT_CLINIC_OR_DEPARTMENT_OTHER): Payer: BC Managed Care – PPO | Admitting: Oncology

## 2014-10-15 VITALS — BP 144/69 | HR 78 | Temp 98.4°F | Resp 18 | Ht 62.0 in | Wt 146.8 lb

## 2014-10-15 DIAGNOSIS — C50919 Malignant neoplasm of unspecified site of unspecified female breast: Secondary | ICD-10-CM | POA: Diagnosis not present

## 2014-10-15 DIAGNOSIS — C7951 Secondary malignant neoplasm of bone: Secondary | ICD-10-CM | POA: Diagnosis not present

## 2014-10-15 DIAGNOSIS — C7952 Secondary malignant neoplasm of bone marrow: Principal | ICD-10-CM

## 2014-10-15 LAB — BASIC METABOLIC PANEL (CC13)
ANION GAP: 9 meq/L (ref 3–11)
BUN: 21.5 mg/dL (ref 7.0–26.0)
CO2: 29 mEq/L (ref 22–29)
Calcium: 10.7 mg/dL — ABNORMAL HIGH (ref 8.4–10.4)
Chloride: 101 mEq/L (ref 98–109)
Creatinine: 0.8 mg/dL (ref 0.6–1.1)
EGFR: 74 mL/min/{1.73_m2} — ABNORMAL LOW (ref >90)
Glucose: 147 mg/dl — ABNORMAL HIGH (ref 70–140)
Potassium: 3.8 mEq/L (ref 3.5–5.1)
SODIUM: 139 meq/L (ref 136–145)

## 2014-10-15 MED ORDER — SODIUM CHLORIDE 0.9 % IV SOLN
Freq: Once | INTRAVENOUS | Status: AC
  Administered 2014-10-15: 13:00:00 via INTRAVENOUS

## 2014-10-15 MED ORDER — ZOLEDRONIC ACID 4 MG/100ML IV SOLN
4.0000 mg | Freq: Once | INTRAVENOUS | Status: AC
  Administered 2014-10-15: 4 mg via INTRAVENOUS
  Filled 2014-10-15: qty 100

## 2014-10-15 NOTE — Progress Notes (Signed)
  [facility] OFFICE PROGRESS NOTE   Diagnosis: Breast cancer  INTERVAL HISTORY:   Cindy Frazier returns as scheduled. She continues Arimidex. She denies hot flashes. She had an episode of and right shoulder/arm discomfort in August. This resolved. She noted a rash at the right anterior chest this week that is fading. She has a history of "shingles ". She denies tooth or jaw pain.  Objective:  Vital signs in last 24 hours:  Blood pressure 144/69, pulse 78, temperature 98.4 F (36.9 C), temperature source Oral, resp. rate 18, height $RemoveBe'5\' 2"'JXrvfWvjY$  (1.575 m), weight 146 lb 12.8 oz (66.588 kg), SpO2 100 %.    HEENT: Neck without mass Lymphatics: No cervical, supra-clavicular, or axillary nodes Resp: Lungs clear bilaterally Cardio: Regular rate and rhythm GI: No hepatomegaly Vascular: No leg edema Musculoskeletal: No pain with motion at the right shoulder or right hip. No spine tenderness.  Skin: Crusting rash at the right upper anterior chest with one lesion at the left medial breast   Portacath/PICC-without erythema  Lab Results:  Potassium 3.8, creatinine 0.8, calcium 10.7   imaging:  Bone scan from 10/02/2014 reviewed with Cindy Frazier.  Medications: I have reviewed the patient's current medications.  Assessment/Plan: 1. Breast cancer-stage IIA (T2 N0) right breast cancer diagnosed in January 2000, status post a right mastectomy and TRAM reconstruction. She completed adjuvant chemotherapy followed by 5 years of tamoxifen and then 5 years of Femara. She completed Femara at the end of July 2010.  2. Right groin pain with an MRI and bone scan February 2015 concerning for metastatic disease involving the right acetabulum/ileum.  Cycle 1 Zometa 03/21/2013   Status post SBRT to the right acetabulum completed 03/26/2013   Initiation of Arimidex 03/27/2013.  Bone scan 09/23/2013. Metastatic disease in the right hemipelvis. Overall degree of activity decreased when  compared to the prior exam.  Bone scan 04/21/2014. Right pelvic/acetabular uptake slightly decreased since the prior exam.  Bone scan 10/02/2014-unchanged metastasis at the right acetabulum, no new metastases, activity at the right greater than left mandible felt to reflect dental disease 3. Status post CT guided right superior acetabular bone biopsy on 02/28/2013. Pathology showed metastatic carcinoma consistent with breast primary; ER positive (90-100%); PR weakly positive (5-10%); HER-2/neu not amplified; Ki-67 20%. 4. CT chest/abdomen/pelvis 02/28/2013 with mild thyroid enlargement and multiple low-attenuation lesions probably due to multinodular goiter; suggestion of asymmetry in the deep lateral central left breast; cholelithiasis; small left renal cysts; degenerative changes throughout the spine; no lymphadenopathy in the abdomen or pelvis; no concerning solid organ lesions; mixed lytic and sclerotic destructive lesion of the right acetabulum. 5. Elevated calcium level-likely related to taking a potassium supplement prior to the blood draw today. We will ask her to return for a chemistry panel in 2 weeks   Disposition:  Cindy Frazier appears stable. She will continue Arimidex. She will receive Zometa today. She will return for an office visit and Zometa in 3 months.  Betsy Coder, MD  10/15/2014  12:41 PM

## 2014-10-15 NOTE — Patient Instructions (Signed)
Zoledronic Acid injection (Hypercalcemia, Oncology) (Zometa) What is this medicine? ZOLEDRONIC ACID (ZOE le dron ik AS id) lowers the amount of calcium loss from bone. It is used to treat too much calcium in your blood from cancer. It is also used to prevent complications of cancer that has spread to the bone. This medicine may be used for other purposes; ask your health care provider or pharmacist if you have questions. What should I tell my health care provider before I take this medicine? They need to know if you have any of these conditions: -aspirin-sensitive asthma -cancer, especially if you are receiving medicines used to treat cancer -dental disease or wear dentures -infection -kidney disease -receiving corticosteroids like dexamethasone or prednisone -an unusual or allergic reaction to zoledronic acid, other medicines, foods, dyes, or preservatives -pregnant or trying to get pregnant -breast-feeding How should I use this medicine? This medicine is for infusion into a vein. It is given by a health care professional in a hospital or clinic setting. Talk to your pediatrician regarding the use of this medicine in children. Special care may be needed. Overdosage: If you think you have taken too much of this medicine contact a poison control center or emergency room at once. NOTE: This medicine is only for you. Do not share this medicine with others. What if I miss a dose? It is important not to miss your dose. Call your doctor or health care professional if you are unable to keep an appointment. What may interact with this medicine? -certain antibiotics given by injection -NSAIDs, medicines for pain and inflammation, like ibuprofen or naproxen -some diuretics like bumetanide, furosemide -teriparatide -thalidomide This list may not describe all possible interactions. Give your health care provider a list of all the medicines, herbs, non-prescription drugs, or dietary supplements you  use. Also tell them if you smoke, drink alcohol, or use illegal drugs. Some items may interact with your medicine. What should I watch for while using this medicine? Visit your doctor or health care professional for regular checkups. It may be some time before you see the benefit from this medicine. Do not stop taking your medicine unless your doctor tells you to. Your doctor may order blood tests or other tests to see how you are doing. Women should inform their doctor if they wish to become pregnant or think they might be pregnant. There is a potential for serious side effects to an unborn child. Talk to your health care professional or pharmacist for more information. You should make sure that you get enough calcium and vitamin D while you are taking this medicine. Discuss the foods you eat and the vitamins you take with your health care professional. Some people who take this medicine have severe bone, joint, and/or muscle pain. This medicine may also increase your risk for jaw problems or a broken thigh bone. Tell your doctor right away if you have severe pain in your jaw, bones, joints, or muscles. Tell your doctor if you have any pain that does not go away or that gets worse. Tell your dentist and dental surgeon that you are taking this medicine. You should not have major dental surgery while on this medicine. See your dentist to have a dental exam and fix any dental problems before starting this medicine. Take good care of your teeth while on this medicine. Make sure you see your dentist for regular follow-up appointments. What side effects may I notice from receiving this medicine? Side effects that you should report   to your doctor or health care professional as soon as possible: -allergic reactions like skin rash, itching or hives, swelling of the face, lips, or tongue -anxiety, confusion, or depression -breathing problems -changes in vision -eye pain -feeling faint or lightheaded,  falls -jaw pain, especially after dental work -mouth sores -muscle cramps, stiffness, or weakness -redness, blistering, peeling or loosening of the skin, including inside the mouth -trouble passing urine or change in the amount of urine Side effects that usually do not require medical attention (report to your doctor or health care professional if they continue or are bothersome): -bone, joint, or muscle pain -constipation -diarrhea -fever -hair loss -irritation at site where injected -loss of appetite -nausea, vomiting -stomach upset -trouble sleeping -trouble swallowing -weak or tired This list may not describe all possible side effects. Call your doctor for medical advice about side effects. You may report side effects to FDA at 1-800-FDA-1088. Where should I keep my medicine? This drug is given in a hospital or clinic and will not be stored at home. NOTE: This sheet is a summary. It may not cover all possible information. If you have questions about this medicine, talk to your doctor, pharmacist, or health care provider.    2016, [employer]. (2013-05-24 14:19:39)  

## 2014-10-21 ENCOUNTER — Telehealth: Payer: Self-pay | Admitting: *Deleted

## 2014-10-21 DIAGNOSIS — C7951 Secondary malignant neoplasm of bone: Principal | ICD-10-CM

## 2014-10-21 DIAGNOSIS — C50919 Malignant neoplasm of unspecified site of unspecified female breast: Secondary | ICD-10-CM

## 2014-10-21 MED ORDER — ANASTROZOLE 1 MG PO TABS: 1.0000 mg | ORAL_TABLET | Freq: Every day | ORAL | Status: DC

## 2014-10-21 NOTE — Telephone Encounter (Signed)
TC from patient requesting refill of her Arimidex. This was escribed with 11 refills. Follow up appts are scheduled. Last office visit was 10/15/14

## 2014-10-30 ENCOUNTER — Other Ambulatory Visit (HOSPITAL_BASED_OUTPATIENT_CLINIC_OR_DEPARTMENT_OTHER): Payer: BC Managed Care – PPO

## 2014-10-30 DIAGNOSIS — C7951 Secondary malignant neoplasm of bone: Secondary | ICD-10-CM | POA: Diagnosis not present

## 2014-10-30 DIAGNOSIS — C50919 Malignant neoplasm of unspecified site of unspecified female breast: Secondary | ICD-10-CM

## 2014-10-30 LAB — BASIC METABOLIC PANEL (CC13)
ANION GAP: 9 meq/L (ref 3–11)
BUN: 19.3 mg/dL (ref 7.0–26.0)
CO2: 30 mEq/L — ABNORMAL HIGH (ref 22–29)
CREATININE: 0.9 mg/dL (ref 0.6–1.1)
Calcium: 10.5 mg/dL — ABNORMAL HIGH (ref 8.4–10.4)
Chloride: 104 mEq/L (ref 98–109)
EGFR: 71 mL/min/{1.73_m2} — ABNORMAL LOW (ref >90)
Glucose: 150 mg/dl — ABNORMAL HIGH (ref 70–140)
Potassium: 3.6 mEq/L (ref 3.5–5.1)
Sodium: 143 mEq/L (ref 136–145)

## 2014-11-02 ENCOUNTER — Ambulatory Visit
Admission: RE | Admit: 2014-11-02 | Discharge: 2014-11-02 | Disposition: A | Discharge status: Home / Self Care | Payer: BC Managed Care – PPO | Source: Ambulatory Visit

## 2014-11-02 ENCOUNTER — Telehealth: Payer: Self-pay | Admitting: Oncology

## 2014-11-02 ENCOUNTER — Telehealth: Payer: Self-pay | Admitting: *Deleted

## 2014-11-02 DIAGNOSIS — C7951 Secondary malignant neoplasm of bone: Principal | ICD-10-CM

## 2014-11-02 DIAGNOSIS — Z1231 Encounter for screening mammogram for malignant neoplasm of breast: Secondary | ICD-10-CM

## 2014-11-02 DIAGNOSIS — C50919 Malignant neoplasm of unspecified site of unspecified female breast: Secondary | ICD-10-CM

## 2014-11-02 NOTE — Telephone Encounter (Signed)
-----   Message from Ladell Pier, MD sent at 10/30/2014  4:31 PM EDT ----- Please call patient, did she take calcium prior to blood draw today? Repeat calcium in 2-3 weeks, still mildly elevated

## 2014-11-02 NOTE — Telephone Encounter (Signed)
s.w. pt and sched NOV appt....pt ok and aware

## 2014-11-02 NOTE — Telephone Encounter (Signed)
Per Dr. Benay Spice; verified with pt that she did NOT take her calcium prior to blood draw on Friday; notified her that calcium is still mildly elevated and MD wants to repeat in 2-3 weeks.  Pt asked "should I continue taking my calcium? Per Dr. Benay Spice; informed pt to continue taking calcium; don't take day of lab draw.  Pt verbalized understanding and expressed appreciation for call.

## 2014-11-20 ENCOUNTER — Other Ambulatory Visit (HOSPITAL_BASED_OUTPATIENT_CLINIC_OR_DEPARTMENT_OTHER): Payer: BC Managed Care – PPO

## 2014-11-20 DIAGNOSIS — C7951 Secondary malignant neoplasm of bone: Secondary | ICD-10-CM | POA: Diagnosis not present

## 2014-11-20 DIAGNOSIS — C50919 Malignant neoplasm of unspecified site of unspecified female breast: Secondary | ICD-10-CM | POA: Diagnosis not present

## 2014-11-20 LAB — BASIC METABOLIC PANEL (CC13)
Anion Gap: 10 mEq/L (ref 3–11)
BUN: 18.7 mg/dL (ref 7.0–26.0)
CO2: 28 meq/L (ref 22–29)
Calcium: 10 mg/dL (ref 8.4–10.4)
Chloride: 102 mEq/L (ref 98–109)
Creatinine: 0.8 mg/dL (ref 0.6–1.1)
EGFR: 78 mL/min/{1.73_m2} — AB (ref >90)
GLUCOSE: 139 mg/dL (ref 70–140)
POTASSIUM: 4.2 meq/L (ref 3.5–5.1)
Sodium: 140 mEq/L (ref 136–145)

## 2014-11-23 ENCOUNTER — Telehealth: Payer: Self-pay | Admitting: *Deleted

## 2014-11-23 NOTE — Telephone Encounter (Signed)
Spoke with pt's husband, Archie. Informed him calcium is normal. Pt to follow up as scheduled. He voiced understanding.

## 2014-11-23 NOTE — Telephone Encounter (Signed)
-----   Message from Ladell Pier, MD sent at 11/20/2014  4:30 PM EST ----- Please call patient, calcium is normal, f/u as scheduled

## 2015-01-14 ENCOUNTER — Other Ambulatory Visit (HOSPITAL_BASED_OUTPATIENT_CLINIC_OR_DEPARTMENT_OTHER): Payer: BC Managed Care – PPO

## 2015-01-14 ENCOUNTER — Ambulatory Visit (HOSPITAL_BASED_OUTPATIENT_CLINIC_OR_DEPARTMENT_OTHER): Payer: BC Managed Care – PPO

## 2015-01-14 ENCOUNTER — Telehealth: Payer: Self-pay | Admitting: Oncology

## 2015-01-14 ENCOUNTER — Ambulatory Visit (HOSPITAL_BASED_OUTPATIENT_CLINIC_OR_DEPARTMENT_OTHER): Payer: BC Managed Care – PPO | Admitting: Oncology

## 2015-01-14 VITALS — BP 126/70 | HR 74 | Temp 98.3°F | Resp 18 | Ht 62.0 in | Wt 145.5 lb

## 2015-01-14 DIAGNOSIS — C7951 Secondary malignant neoplasm of bone: Secondary | ICD-10-CM

## 2015-01-14 DIAGNOSIS — C50919 Malignant neoplasm of unspecified site of unspecified female breast: Secondary | ICD-10-CM

## 2015-01-14 DIAGNOSIS — C50911 Malignant neoplasm of unspecified site of right female breast: Secondary | ICD-10-CM

## 2015-01-14 DIAGNOSIS — Z853 Personal history of malignant neoplasm of breast: Secondary | ICD-10-CM | POA: Diagnosis not present

## 2015-01-14 DIAGNOSIS — C7952 Secondary malignant neoplasm of bone marrow: Principal | ICD-10-CM

## 2015-01-14 LAB — BASIC METABOLIC PANEL
ANION GAP: 11 meq/L (ref 3–11)
BUN: 17.6 mg/dL (ref 7.0–26.0)
CALCIUM: 9.8 mg/dL (ref 8.4–10.4)
CO2: 25 mEq/L (ref 22–29)
Chloride: 102 mEq/L (ref 98–109)
Creatinine: 0.8 mg/dL (ref 0.6–1.1)
EGFR: 75 mL/min/{1.73_m2} — AB (ref >90)
GLUCOSE: 156 mg/dL — AB (ref 70–140)
POTASSIUM: 3.8 meq/L (ref 3.5–5.1)
SODIUM: 138 meq/L (ref 136–145)

## 2015-01-14 MED ORDER — ZOLEDRONIC ACID 4 MG/100ML IV SOLN
4.0000 mg | Freq: Once | INTRAVENOUS | Status: AC
  Administered 2015-01-14: 4 mg via INTRAVENOUS
  Filled 2015-01-14: qty 100

## 2015-01-14 MED ORDER — SODIUM CHLORIDE 0.9 % IV SOLN
Freq: Once | INTRAVENOUS | Status: AC
  Administered 2015-01-14: 13:00:00 via INTRAVENOUS

## 2015-01-14 NOTE — Patient Instructions (Signed)

## 2015-01-14 NOTE — Progress Notes (Signed)
  [place] OFFICE PROGRESS NOTE   Diagnosis: Breast cancer  INTERVAL HISTORY:   Cindy Frazier returns as scheduled. She continues Arimidex. No hot flashes. Stable mild discomfort with certain positions at the right hip. No tooth pain or loosening. She did not take calcium today.  Objective:  Vital signs in last 24 hours:  Blood pressure 126/70, pulse 74, temperature 98.3 F (36.8 C), temperature source Oral, resp. rate 18, height _0  (1.575 m), weight 145 lb 8 oz (65.998 kg), SpO2 100 %.    HEENT: Neck without mass Lymphatics: No cervical, supra-clavicular, or axillary nodes Resp: Lungs clear bilaterally Cardio: Regular rate and rhythm GI: No hepatomegaly Vascular: No leg edema   Lab Results:  Lab Results  Component Value Date   WBC 7.2 02/21/2013   HGB 13.8 02/21/2013   HCT 43.9 02/21/2013   MCV 83.5 02/21/2013   PLT 291 02/21/2013   NEUTROABS 4.2 02/21/2013   potassium 3.8, calcium 9.8   Medications: I have reviewed the patient's current medications.  Assessment/Plan: 1. Breast cancer-stage IIA (T2 N0) right breast cancer diagnosed in January 2000, status post a right mastectomy and TRAM reconstruction. She completed adjuvant chemotherapy followed by 5 years of tamoxifen and then 5 years of Femara. She completed Femara at the end of July 2010.  2. Right groin pain with an MRI and bone scan February 2015 concerning for metastatic disease involving the right acetabulum/ileum.  Cycle 1 Zometa 03/21/2013   Status post SBRT to the right acetabulum completed 03/26/2013   Initiation of Arimidex 03/27/2013.  Bone scan 09/23/2013. Metastatic disease in the right hemipelvis. Overall degree of activity decreased when compared to the prior exam.  Bone scan 04/21/2014. Right pelvic/acetabular uptake slightly decreased since the prior exam.  Bone scan 10/02/2014-unchanged metastasis at the right acetabulum, no new metastases, activity at the right  greater than left mandible felt to reflect dental disease 3. Status post CT guided right superior acetabular bone biopsy on 02/28/2013. Pathology showed metastatic carcinoma consistent with breast primary; ER positive (90-100%); PR weakly positive (5-10%); HER-2/neu not amplified; Ki-67 20%. 4. CT chest/abdomen/pelvis 02/28/2013 with mild thyroid enlargement and multiple low-attenuation lesions probably due to multinodular goiter; suggestion of asymmetry in the deep lateral central left breast; cholelithiasis; small left renal cysts; degenerative changes throughout the spine; no lymphadenopathy in the abdomen or pelvis; no concerning solid organ lesions; mixed lytic and sclerotic destructive lesion of the right acetabulum. 5. History of Elevated calcium level-likely related to taking a potassium supplement prior to the blood draws   Disposition:  Cindy Frazier appears stable. She will continue Arimidex. She is scheduled to receive Zometa today. The plan is to schedule a restaging bone scan at an eight-month interval.  Cindy Frazier will return for an office visit after the bone scan in June.  Betsy Coder, MD  01/14/2015  12:19 PM

## 2015-01-14 NOTE — Telephone Encounter (Signed)
Gv pt appts for 4/7 + 6/13. Advised ptr c-sched would call to schedule bone scan. Pt verbalized understanding.

## 2015-01-28 ENCOUNTER — Ambulatory Visit (INDEPENDENT_AMBULATORY_CARE_PROVIDER_SITE_OTHER): Payer: BC Managed Care – PPO

## 2015-01-28 ENCOUNTER — Encounter: Payer: Self-pay | Admitting: Family Medicine

## 2015-01-28 ENCOUNTER — Ambulatory Visit (INDEPENDENT_AMBULATORY_CARE_PROVIDER_SITE_OTHER): Payer: BC Managed Care – PPO | Admitting: Family Medicine

## 2015-01-28 VITALS — BP 162/80 | HR 89 | Temp 98.5°F | Resp 16 | Ht 62.0 in | Wt 145.8 lb

## 2015-01-28 DIAGNOSIS — M25551 Pain in right hip: Secondary | ICD-10-CM

## 2015-01-28 DIAGNOSIS — C50919 Malignant neoplasm of unspecified site of unspecified female breast: Secondary | ICD-10-CM

## 2015-01-28 DIAGNOSIS — C799 Secondary malignant neoplasm of unspecified site: Secondary | ICD-10-CM

## 2015-01-28 MED ORDER — HYDROCODONE-ACETAMINOPHEN 7.5-325 MG PO TABS: 1.0000 | ORAL_TABLET | Freq: Four times a day (QID) | ORAL | Status: DC | PRN

## 2015-01-28 NOTE — Progress Notes (Signed)
Patient ID: Cindy Frazier, female    DOB: 10-20-1950  Age: 65 y.o. MRN: [id number]  Chief Complaint  Patient presents with  . Hip Pain    pt has bone cancer and it is causing pain./ right side/ x today  . Leg Pain    right    Subjective:   Patient had breast cancer 15 years ago, and 2 years ago had metastatic disease in the right acetabulum. She was treated and has been stable. She's been getting bone scans about every 6 months which are staying unchanged. 9 days ago she started having some pain in the right hip and is gotten steadily worse. Tonight she came in to get something for the pain and plans to talk to her oncologist tomorrow. No known trauma. It just hurts a deep hurting. She is most comfortable standing probably, but it doesn't seem to hurt her to move it around.  Current allergies, medications, problem list, past/family and social histories reviewed.  Objective:  BP 162/80 mmHg  Pulse 89  Temp(Src) 98.5 F (36.9 C) (Oral)  Resp 16  Ht 5\' 2"  (1.575 m)  Wt 145 lb 12.8 oz (66.134 kg)  BMI 26.66 kg/m2  SpO2 98%  No acute distress, walking in the room, but just says it hurts her. Good range of motion of the right hip without any point tenderness.   Report from [place] 2 years ago was reviewed, as were prior bone scans.  Assessment & Plan:   Assessment: 1. Acute right hip pain   2. Metastatic breast cancer ([place])       Plan: Plain x-ray of hip to make sure there is no impending major fracture.   [facility] reading (PRIMARY) by  Dr. Linna Darner Sclerotic right acetabulum consistent with her history of metastatic breast cancer.   Orders Placed This Encounter  Procedures  . DG HIP UNILAT W OR W/O PELVIS 2-3 VIEWS RIGHT    Order Specific Question:  Reason for Exam (SYMPTOM  OR DIAGNOSIS REQUIRED)    Answer:  right hip pain about 9 days.  History of metastatic breast cancer to right acetabulum, has been stable.    Order Specific Question:  Preferred imaging location?   Answer:  External    Meds ordered this encounter  Medications  . HYDROcodone-acetaminophen (NORCO) 7.5-325 MG tablet    Sig: Take 1 tablet by mouth every 6 (six) hours as needed.    Dispense:  30 tablet    Refill:  0         Patient Instructions   Take the hydrocodone pain pills one every 4-6 hours as needed for pain  Contact your oncologist tomorrow morning. (Dr. Kavin Leech)  Do not exceed 3000 mg of acetaminophen in 24 hours. The hydrocodone pain pills have 325 mg of acetaminophen (Tylenol) in them. You can take some additional Tylenol if you're not taking a lot of the pain pills.  Ibuprofen maximum 800 mg 3 times daily or Aleve (naproxen) maximum of 440 mg twice daily can be taken in addition to the above if needed. Sometimes these medicines, the NSAIDS, do better for bone pain.  Return at anytime if needed     Return if symptoms worsen or fail to improve.   Kemond Amorin, MD 01/28/2015

## 2015-01-28 NOTE — Patient Instructions (Addendum)
  Take the hydrocodone pain pills one every 4-6 hours as needed for pain  Contact your oncologist tomorrow morning. (Dr. Kavin Leech)  Do not exceed 3000 mg of acetaminophen in 24 hours. The hydrocodone pain pills have 325 mg of acetaminophen (Tylenol) in them. You can take some additional Tylenol if you're not taking a lot of the pain pills.  Ibuprofen maximum 800 mg 3 times daily or Aleve (naproxen) maximum of 440 mg twice daily can be taken in addition to the above if needed. Sometimes these medicines, the NSAIDS, do better for bone pain.  Return at anytime if needed

## 2015-01-29 ENCOUNTER — Telehealth: Payer: Self-pay | Admitting: *Deleted

## 2015-01-29 NOTE — Telephone Encounter (Signed)
Pt states she has been having hip and leg pain. Went to Urgent Care last night and had xray- nothing broken. Given RX for hydrocodone- has taken X 2 and is having some relief. Wanted to make Dr Benay Spice aware.

## 2015-01-29 NOTE — Telephone Encounter (Signed)
Returned call to pt, call office if pain worsens- per Dr. Benay Spice. Pt voiced understanding, reports this pain began about 10 days ago. At first it only hurt at night. Yesterday it hurt during the day which concerned her. Pt wonders if she should have scan at 6 mo interval instead of planned 8 mo interval. She will continue Hydrocodone PRN, monitor pain and will keep [place] updated.

## 2015-02-09 ENCOUNTER — Telehealth: Payer: Self-pay

## 2015-02-09 NOTE — Telephone Encounter (Signed)
Pt called asking if she could have labs drawn here for her oral surgeon. Returned call and LVM that our policy is to draw labs for an external doctor only if pt already has lab appt for her doctor here. Suggested she call her oral surgeon to see if he has preferences for which lab she uses.

## 2015-02-17 ENCOUNTER — Telehealth: Payer: Self-pay | Admitting: *Deleted

## 2015-02-17 NOTE — Telephone Encounter (Signed)
"  I need advice.  I take Zometa for a bone issue.  I need an extraction or a root canal.  The oral surgeon did a CTX lab test that did not come back with good results.  It was recommended I skip the next zometa in April 16, 2014 to have more labs.  Not having any pain.  I know a root canal is not permanent.  Not sure if I should miss the zometa.  Which procedure does Dr.Sherrill recommend."  Return mobile [phone].

## 2015-02-18 NOTE — Telephone Encounter (Signed)
Dr. Benay Spice ordered it is okay for patient to miss Zometa in April.  Patient instructed to call office with procedure and dates because the oral surgeon has indicated she should wait several months for healing before she can receive the next zometa infusion.  Awaiting return call from patient for any new scheduling needs to be scheduled.    FYI  Per lab tests on line, CTx is an acronym for C-terminal telopeptide. During bone resorption, the dominant type 1 collagen is degraded and, during this collagen breakdown, the telopeptide (CTX) is released. Thus, serum levels of CTX can be used as an indicator of bone breakdown/resorption.

## 2015-04-16 ENCOUNTER — Other Ambulatory Visit: Payer: BC Managed Care – PPO

## 2015-04-16 ENCOUNTER — Ambulatory Visit: Payer: BC Managed Care – PPO

## 2015-04-16 ENCOUNTER — Telehealth: Payer: Self-pay

## 2015-04-16 NOTE — Telephone Encounter (Signed)
Pt called to verify her appts were cancelled for today. No return call needed if this is true.

## 2015-06-18 ENCOUNTER — Telehealth: Payer: Self-pay | Admitting: *Deleted

## 2015-06-18 ENCOUNTER — Encounter (HOSPITAL_COMMUNITY)
Admission: RE | Admit: 2015-06-18 | Discharge: 2015-06-18 | Disposition: A | Discharge status: Home / Self Care | Payer: Medicare Other | Source: Ambulatory Visit | Attending: Oncology | Admitting: Oncology

## 2015-06-18 DIAGNOSIS — C7951 Secondary malignant neoplasm of bone: Secondary | ICD-10-CM | POA: Diagnosis not present

## 2015-06-18 DIAGNOSIS — C50911 Malignant neoplasm of unspecified site of right female breast: Secondary | ICD-10-CM | POA: Diagnosis not present

## 2015-06-18 MED ORDER — TECHNETIUM TC 99M MEDRONATE IV KIT
23.2000 | PACK | Freq: Once | INTRAVENOUS | Status: AC | PRN
  Administered 2015-06-18: 23.2 via INTRAVENOUS

## 2015-06-18 NOTE — Telephone Encounter (Signed)
-----   Message from Ladell Pier, MD sent at 06/18/2015  4:50 PM EDT ----- Please call patient, bone scan is stable, f/u as scheduled

## 2015-06-18 NOTE — Telephone Encounter (Signed)
Called pt with bone scan result. Stable, per Dr. Benay Spice: follow up as scheduled. Pt voiced understanding.

## 2015-06-22 ENCOUNTER — Ambulatory Visit (HOSPITAL_BASED_OUTPATIENT_CLINIC_OR_DEPARTMENT_OTHER): Payer: Medicare Other | Admitting: Oncology

## 2015-06-22 ENCOUNTER — Telehealth: Payer: Self-pay | Admitting: Oncology

## 2015-06-22 VITALS — BP 135/68 | HR 78 | Temp 98.5°F | Resp 20 | Ht 62.0 in | Wt 144.4 lb

## 2015-06-22 DIAGNOSIS — C7951 Secondary malignant neoplasm of bone: Secondary | ICD-10-CM

## 2015-06-22 DIAGNOSIS — C50911 Malignant neoplasm of unspecified site of right female breast: Secondary | ICD-10-CM | POA: Diagnosis not present

## 2015-06-22 NOTE — Progress Notes (Signed)
[facility] OFFICE PROGRESS NOTE   Diagnosis: Breast cancer  INTERVAL HISTORY:   Cindy Frazier returns as scheduled. She continues Arimidex. She developed acute pain in the right hip area approximately 1 week after the last dose of Zometa. The pain lasted 2 days. No trauma. No associated symptoms. The pain has not recurred. She was evaluated at urgent care on 01/28/2015 and a plain x-ray revealed no acute abnormality.  She underwent a root canal instead of a tooth extraction. She reports her dentist decided against a tooth extraction based on a "CTX "test. She has no tooth pain or loosening.  She feels well.  Objective:  Vital signs in last 24 hours:  Blood pressure 135/68, pulse 78, temperature 98.5 F (36.9 C), temperature source Oral, resp. rate 20, height _0  (1.575 m), weight 144 lb 6.4 oz (65.499 kg), SpO2 98 %.    HEENT:  Neck without mass Lymphatics:  No cervical, supraclavicular, or axillary nodes Resp:  Lungs clear bilaterally Cardio:  Regular rate and rhythm GI:  No hepatomegaly Vascular:  No leg edema Musculoskeletal: No pain with motion at the right hip  Breast: Status post right mastectomy with a TRAM reconstruction. No evidence for local tumor recurrence. Left breast without mass.   Portacath/PICC-without erythema  Lab Results:  Lab Results  Component Value Date   WBC 7.2 02/21/2013   HGB 13.8 02/21/2013   HCT 43.9 02/21/2013   MCV 83.5 02/21/2013   PLT 291 02/21/2013   NEUTROABS 4.2 02/21/2013      Imaging:Bone scan 06/18/2015, compared to 10/02/2014-stable areas of degenerative uptake. Stable abnormal uptake at the right acetabulum. No new or progressive findings.   Medications: I have reviewed the patient's current medications.  Assessment/Plan: 1. Breast cancer-stage IIA (T2 N0) right breast cancer diagnosed in January 2000, status post a right mastectomy and TRAM reconstruction. She completed adjuvant chemotherapy followed by 5  years of tamoxifen and then 5 years of Femara. She completed Femara at the end of July 2010.  2. Right groin pain with an MRI and bone scan February 2015 concerning for metastatic disease involving the right acetabulum/ileum.  Cycle 1 Zometa 03/21/2013   Status post SBRT to the right acetabulum completed 03/26/2013   Initiation of Arimidex 03/27/2013.  Bone scan 09/23/2013. Metastatic disease in the right hemipelvis. Overall degree of activity decreased when compared to the prior exam.  Bone scan 04/21/2014. Right pelvic/acetabular uptake slightly decreased since the prior exam.  Bone scan 10/02/2014-unchanged metastasis at the right acetabulum, no new metastases, activity at the right greater than left mandible felt to reflect dental disease  Bone scan 06/18/2015-no change in the right acetabulum metastasis. No new metastases.  3. Status post CT guided right superior acetabular bone biopsy on 02/28/2013. Pathology showed metastatic carcinoma consistent with breast primary; ER positive (90-100%); PR weakly positive (5-10%); HER-2/neu not amplified; Ki-67 20%. 4. CT chest/abdomen/pelvis 02/28/2013 with mild thyroid enlargement and multiple low-attenuation lesions probably due to multinodular goiter; suggestion of asymmetry in the deep lateral central left breast; cholelithiasis; small left renal cysts; degenerative changes throughout the spine; no lymphadenopathy in the abdomen or pelvis; no concerning solid organ lesions; mixed lytic and sclerotic destructive lesion of the right acetabulum. 5. History of Elevated calcium level-likely related to taking a potassium supplement prior to the blood draws    Disposition:  She appears stable. There is no clinical or x-ray evidence of progressive disease. We discussed continuing Zometa. The plan is to continue Zometa every 3  months. She continues Arimidex.  Ms. Lovins will return for an office visit in approximately 6 months. We will plan for a  restaging bone scan in 9 months unless she develops new symptoms.    Cindy Coder, MD  06/22/2015  3:57 PM

## 2015-06-22 NOTE — Telephone Encounter (Signed)
per pof to sch pt appt-sent MW email to sch pt trmt zometa-will call pt after reply

## 2015-06-24 ENCOUNTER — Telehealth: Payer: Self-pay | Admitting: *Deleted

## 2015-06-24 NOTE — Telephone Encounter (Signed)
Per staff message and POF I have scheduled appts. Advised scheduler of appts. JMW  

## 2015-06-25 ENCOUNTER — Telehealth: Payer: Self-pay | Admitting: Oncology

## 2015-06-25 ENCOUNTER — Other Ambulatory Visit: Payer: Medicare Other

## 2015-06-25 NOTE — Telephone Encounter (Signed)
cld and left pt a message of time & date of appt for 6/30@8 :45-adv to get update sch w/remaining appts

## 2015-07-09 ENCOUNTER — Other Ambulatory Visit (HOSPITAL_BASED_OUTPATIENT_CLINIC_OR_DEPARTMENT_OTHER): Payer: Medicare Other

## 2015-07-09 ENCOUNTER — Ambulatory Visit (HOSPITAL_BASED_OUTPATIENT_CLINIC_OR_DEPARTMENT_OTHER): Payer: Medicare Other

## 2015-07-09 VITALS — BP 141/70 | HR 65 | Temp 98.0°F | Resp 18

## 2015-07-09 DIAGNOSIS — C50911 Malignant neoplasm of unspecified site of right female breast: Secondary | ICD-10-CM

## 2015-07-09 DIAGNOSIS — C7951 Secondary malignant neoplasm of bone: Secondary | ICD-10-CM

## 2015-07-09 LAB — BASIC METABOLIC PANEL
ANION GAP: 13 meq/L — AB (ref 3–11)
BUN: 23.8 mg/dL (ref 7.0–26.0)
CHLORIDE: 102 meq/L (ref 98–109)
CO2: 27 meq/L (ref 22–29)
Calcium: 10.3 mg/dL (ref 8.4–10.4)
Creatinine: 0.9 mg/dL (ref 0.6–1.1)
EGFR: 72 mL/min/{1.73_m2} — AB (ref >90)
GLUCOSE: 166 mg/dL — AB (ref 70–140)
POTASSIUM: 4 meq/L (ref 3.5–5.1)
Sodium: 141 mEq/L (ref 136–145)

## 2015-07-09 MED ORDER — SODIUM CHLORIDE 0.9 % IV SOLN
Freq: Once | INTRAVENOUS | Status: AC
  Administered 2015-07-09: 10:00:00 via INTRAVENOUS

## 2015-07-09 MED ORDER — ZOLEDRONIC ACID 4 MG/100ML IV SOLN
4.0000 mg | Freq: Once | INTRAVENOUS | Status: AC
  Administered 2015-07-09: 4 mg via INTRAVENOUS
  Filled 2015-07-09: qty 100

## 2015-07-09 NOTE — Addendum Note (Signed)
Addended by: Sharlynn Oliphant A on: 07/09/2015 10:59 AM   Modules accepted: Orders

## 2015-07-09 NOTE — Patient Instructions (Signed)

## 2015-10-07 ENCOUNTER — Other Ambulatory Visit (HOSPITAL_BASED_OUTPATIENT_CLINIC_OR_DEPARTMENT_OTHER): Payer: Medicare Other

## 2015-10-07 ENCOUNTER — Ambulatory Visit (HOSPITAL_BASED_OUTPATIENT_CLINIC_OR_DEPARTMENT_OTHER): Payer: Medicare Other

## 2015-10-07 VITALS — BP 147/67 | HR 76 | Temp 98.4°F | Resp 16

## 2015-10-07 DIAGNOSIS — C7951 Secondary malignant neoplasm of bone: Secondary | ICD-10-CM

## 2015-10-07 DIAGNOSIS — C50911 Malignant neoplasm of unspecified site of right female breast: Secondary | ICD-10-CM

## 2015-10-07 LAB — BASIC METABOLIC PANEL
ANION GAP: 13 meq/L — AB (ref 3–11)
BUN: 27.5 mg/dL — ABNORMAL HIGH (ref 7.0–26.0)
CO2: 24 mEq/L (ref 22–29)
Calcium: 10.2 mg/dL (ref 8.4–10.4)
Chloride: 101 mEq/L (ref 98–109)
Creatinine: 0.9 mg/dL (ref 0.6–1.1)
EGFR: 72 mL/min/{1.73_m2} — AB (ref >90)
Glucose: 209 mg/dl — ABNORMAL HIGH (ref 70–140)
POTASSIUM: 4.1 meq/L (ref 3.5–5.1)
SODIUM: 138 meq/L (ref 136–145)

## 2015-10-07 MED ORDER — ZOLEDRONIC ACID 4 MG/100ML IV SOLN
4.0000 mg | Freq: Once | INTRAVENOUS | Status: AC
  Administered 2015-10-07: 4 mg via INTRAVENOUS
  Filled 2015-10-07: qty 100

## 2015-10-07 MED ORDER — SODIUM CHLORIDE 0.9 % IV SOLN
Freq: Once | INTRAVENOUS | Status: AC
  Administered 2015-10-07: 12:00:00 via INTRAVENOUS

## 2015-10-07 NOTE — Patient Instructions (Signed)

## 2015-10-14 ENCOUNTER — Other Ambulatory Visit: Payer: Self-pay | Admitting: Oncology

## 2015-10-14 DIAGNOSIS — C50919 Malignant neoplasm of unspecified site of unspecified female breast: Secondary | ICD-10-CM

## 2015-10-14 DIAGNOSIS — C7951 Secondary malignant neoplasm of bone: Principal | ICD-10-CM

## 2015-10-14 DIAGNOSIS — Z1231 Encounter for screening mammogram for malignant neoplasm of breast: Secondary | ICD-10-CM

## 2015-11-04 ENCOUNTER — Other Ambulatory Visit: Payer: Self-pay | Admitting: Oncology

## 2015-11-04 DIAGNOSIS — C7951 Secondary malignant neoplasm of bone: Principal | ICD-10-CM

## 2015-11-04 DIAGNOSIS — C50919 Malignant neoplasm of unspecified site of unspecified female breast: Secondary | ICD-10-CM

## 2015-11-08 ENCOUNTER — Ambulatory Visit: Payer: Medicare Other

## 2015-12-01 ENCOUNTER — Ambulatory Visit
Admission: RE | Admit: 2015-12-01 | Discharge: 2015-12-01 | Disposition: A | Discharge status: Home / Self Care | Payer: Medicare Other | Source: Ambulatory Visit | Attending: Oncology | Admitting: Oncology

## 2015-12-01 DIAGNOSIS — Z1231 Encounter for screening mammogram for malignant neoplasm of breast: Secondary | ICD-10-CM

## 2015-12-12 ENCOUNTER — Telehealth: Payer: Self-pay | Admitting: Oncology

## 2015-12-12 NOTE — Telephone Encounter (Signed)
S/w pt, advised md appt on 12/22 moved due to md pal. Gave pt appt for 01/21/16 @ 10.30. She will keep lab and zometa on 12/31/15.

## 2015-12-31 ENCOUNTER — Other Ambulatory Visit (HOSPITAL_BASED_OUTPATIENT_CLINIC_OR_DEPARTMENT_OTHER): Payer: Medicare Other

## 2015-12-31 ENCOUNTER — Ambulatory Visit: Payer: Medicare Other | Admitting: Oncology

## 2015-12-31 ENCOUNTER — Ambulatory Visit (HOSPITAL_BASED_OUTPATIENT_CLINIC_OR_DEPARTMENT_OTHER): Payer: Medicare Other

## 2015-12-31 VITALS — BP 135/74 | HR 79 | Temp 98.8°F | Resp 16

## 2015-12-31 DIAGNOSIS — C7951 Secondary malignant neoplasm of bone: Secondary | ICD-10-CM

## 2015-12-31 DIAGNOSIS — C50911 Malignant neoplasm of unspecified site of right female breast: Secondary | ICD-10-CM | POA: Diagnosis not present

## 2015-12-31 DIAGNOSIS — C50919 Malignant neoplasm of unspecified site of unspecified female breast: Secondary | ICD-10-CM

## 2015-12-31 LAB — COMPREHENSIVE METABOLIC PANEL
ALT: 22 U/L (ref 0–55)
AST: 14 U/L (ref 5–34)
Albumin: 4 g/dL (ref 3.5–5.0)
Alkaline Phosphatase: 57 U/L (ref 40–150)
Anion Gap: 11 mEq/L (ref 3–11)
BILIRUBIN TOTAL: 0.37 mg/dL (ref 0.20–1.20)
BUN: 19.3 mg/dL (ref 7.0–26.0)
CHLORIDE: 101 meq/L (ref 98–109)
CO2: 25 meq/L (ref 22–29)
Calcium: 10.1 mg/dL (ref 8.4–10.4)
Creatinine: 0.8 mg/dL (ref 0.6–1.1)
EGFR: 81 mL/min/{1.73_m2} — AB (ref >90)
GLUCOSE: 130 mg/dL (ref 70–140)
Potassium: 4 mEq/L (ref 3.5–5.1)
SODIUM: 138 meq/L (ref 136–145)
TOTAL PROTEIN: 7.6 g/dL (ref 6.4–8.3)

## 2015-12-31 MED ORDER — ZOLEDRONIC ACID 4 MG/100ML IV SOLN
4.0000 mg | Freq: Once | INTRAVENOUS | Status: AC
  Administered 2015-12-31: 4 mg via INTRAVENOUS
  Filled 2015-12-31: qty 100

## 2015-12-31 MED ORDER — SODIUM CHLORIDE 0.9 % IV SOLN
Freq: Once | INTRAVENOUS | Status: AC
  Administered 2015-12-31: 13:00:00 via INTRAVENOUS

## 2015-12-31 NOTE — Patient Instructions (Signed)

## 2016-01-21 ENCOUNTER — Ambulatory Visit (HOSPITAL_BASED_OUTPATIENT_CLINIC_OR_DEPARTMENT_OTHER): Payer: Medicare Other | Admitting: Oncology

## 2016-01-21 ENCOUNTER — Telehealth: Payer: Self-pay | Admitting: Oncology

## 2016-01-21 VITALS — BP 138/69 | HR 90 | Temp 98.8°F | Resp 18 | Wt 142.6 lb

## 2016-01-21 DIAGNOSIS — C50911 Malignant neoplasm of unspecified site of right female breast: Secondary | ICD-10-CM

## 2016-01-21 DIAGNOSIS — C7951 Secondary malignant neoplasm of bone: Secondary | ICD-10-CM | POA: Diagnosis not present

## 2016-01-21 NOTE — Progress Notes (Signed)
  [place] OFFICE PROGRESS NOTE   Diagnosis: Breast cancer  INTERVAL HISTORY:   Cindy Frazier returns as scheduled. She continues Arimidex. She reports mild discomfort at the right hip with certain movements and when she is lying on her side at night. The discomfort is not severe. She is not taking pain medication. She continues Zometa. No jaw or tooth pain. A left mammogram on 12/01/2015 was negative.  Objective:  Vital signs in last 24 hours:  Blood pressure 138/69, pulse 90, temperature 98.8 F (37.1 C), temperature source Oral, resp. rate 18, weight 142 lb 9.6 oz (64.7 kg), SpO2 99 %.    HEENT: Neck without mass Lymphatics: No cervical, supra-clavicular, or axillary nodes Resp: Lungs clear bilaterally Cardio: Regular rate and rhythm GI: No hepatomegaly Vascular: No leg edema Breasts: Status post right mastectomy with a TRAM reconstruction. No evidence for chest wall tumor recurrence. Left breast without mass.  Musculoskeletal: No pain with motion at the right hip    Medications: I have reviewed the patient's current medications.  Assessment/Plan: 1. Breast cancer-stage IIA (T2 N0) right breast cancer diagnosed in January 2000, status post a right mastectomy and TRAM reconstruction. She completed adjuvant chemotherapy followed by 5 years of tamoxifen and then 5 years of Femara. She completed Femara at the end of July 2010.  2. Right groin pain with an MRI and bone scan February 2015 concerning for metastatic disease involving the right acetabulum/ileum.  Cycle 1 Zometa 03/21/2013   Status post SBRT to the right acetabulum completed 03/26/2013   Initiation of Arimidex 03/27/2013.  Bone scan 09/23/2013. Metastatic disease in the right hemipelvis. Overall degree of activity decreased when compared to the prior exam.  Bone scan 04/21/2014. Right pelvic/acetabular uptake slightly decreased since the prior exam.  Bone scan 10/02/2014-unchanged metastasis at  the right acetabulum, no new metastases, activity at the right greater than left mandible felt to reflect dental disease  Bone scan 06/18/2015-no change in the right acetabulum metastasis. No new metastases.  3. Status post CT guided right superior acetabular bone biopsy on 02/28/2013. Pathology showed metastatic carcinoma consistent with breast primary; ER positive (90-100%); PR weakly positive (5-10%); HER-2/neu not amplified; Ki-67 20%. 4. CT chest/abdomen/pelvis 02/28/2013 with mild thyroid enlargement and multiple low-attenuation lesions probably due to multinodular goiter; suggestion of asymmetry in the deep lateral central left breast; cholelithiasis; small left renal cysts; degenerative changes throughout the spine; no lymphadenopathy in the abdomen or pelvis; no concerning solid organ lesions; mixed lytic and sclerotic destructive lesion of the right acetabulum. 5. History of Elevated calcium level-likely related to taking a potassium supplement prior to the blood draws    Disposition:  Cindy Frazier appears unchanged. She will continue Arimidex. She is maintained on every 3 month Zometa. She will be scheduled for a bone scan in April. She is scheduled for the next cycle of Zometa on 03/31/2016. She will return for an office visit and Zometa on 06/30/2016.  15 minutes were spent with the patient today. The majority of the time was used for counseling" of care.  Betsy Coder, MD  01/21/2016  11:03 AM

## 2016-01-21 NOTE — Telephone Encounter (Signed)
Appointments scheduled per 11/2 LOS. Patient given AVS report and calendars with future scheduled appointments °

## 2016-02-21 ENCOUNTER — Other Ambulatory Visit: Payer: Self-pay | Admitting: Oncology

## 2016-02-21 DIAGNOSIS — C50919 Malignant neoplasm of unspecified site of unspecified female breast: Secondary | ICD-10-CM

## 2016-02-21 DIAGNOSIS — C7951 Secondary malignant neoplasm of bone: Principal | ICD-10-CM

## 2016-02-23 ENCOUNTER — Other Ambulatory Visit: Payer: Self-pay | Admitting: *Deleted

## 2016-02-23 DIAGNOSIS — C7951 Secondary malignant neoplasm of bone: Principal | ICD-10-CM

## 2016-02-23 DIAGNOSIS — C50919 Malignant neoplasm of unspecified site of unspecified female breast: Secondary | ICD-10-CM

## 2016-02-23 MED ORDER — ANASTROZOLE 1 MG PO TABS: 1.0000 mg | ORAL_TABLET | Freq: Every day | ORAL | 2 refills | Status: DC

## 2016-03-31 ENCOUNTER — Ambulatory Visit (HOSPITAL_BASED_OUTPATIENT_CLINIC_OR_DEPARTMENT_OTHER): Payer: Medicare Other

## 2016-03-31 ENCOUNTER — Other Ambulatory Visit (HOSPITAL_BASED_OUTPATIENT_CLINIC_OR_DEPARTMENT_OTHER): Payer: Medicare Other

## 2016-03-31 ENCOUNTER — Other Ambulatory Visit: Payer: Self-pay | Admitting: *Deleted

## 2016-03-31 VITALS — BP 146/79 | HR 77 | Temp 98.9°F | Resp 18

## 2016-03-31 DIAGNOSIS — C7951 Secondary malignant neoplasm of bone: Secondary | ICD-10-CM

## 2016-03-31 DIAGNOSIS — C50911 Malignant neoplasm of unspecified site of right female breast: Secondary | ICD-10-CM

## 2016-03-31 LAB — BASIC METABOLIC PANEL
Anion Gap: 11 mEq/L (ref 3–11)
BUN: 22.7 mg/dL (ref 7.0–26.0)
CALCIUM: 10.3 mg/dL (ref 8.4–10.4)
CO2: 27 meq/L (ref 22–29)
Chloride: 100 mEq/L (ref 98–109)
Creatinine: 0.8 mg/dL (ref 0.6–1.1)
EGFR: 77 mL/min/{1.73_m2} — AB (ref >90)
GLUCOSE: 170 mg/dL — AB (ref 70–140)
Potassium: 3.8 mEq/L (ref 3.5–5.1)
Sodium: 139 mEq/L (ref 136–145)

## 2016-03-31 MED ORDER — SODIUM CHLORIDE 0.9 % IV SOLN
Freq: Once | INTRAVENOUS | Status: AC
  Administered 2016-03-31: 11:00:00 via INTRAVENOUS

## 2016-03-31 MED ORDER — ZOLEDRONIC ACID 4 MG/100ML IV SOLN
4.0000 mg | Freq: Once | INTRAVENOUS | Status: AC
  Administered 2016-03-31: 4 mg via INTRAVENOUS
  Filled 2016-03-31: qty 100

## 2016-03-31 NOTE — Patient Instructions (Signed)

## 2016-04-21 ENCOUNTER — Ambulatory Visit (HOSPITAL_COMMUNITY)
Admission: RE | Admit: 2016-04-21 | Discharge: 2016-04-21 | Disposition: A | Discharge status: Home / Self Care | Payer: Medicare Other | Source: Ambulatory Visit | Attending: Oncology | Admitting: Oncology

## 2016-04-21 ENCOUNTER — Encounter (HOSPITAL_COMMUNITY)
Admission: RE | Admit: 2016-04-21 | Discharge: 2016-04-21 | Disposition: A | Discharge status: Home / Self Care | Payer: Medicare Other | Source: Ambulatory Visit | Attending: Oncology | Admitting: Oncology

## 2016-04-21 DIAGNOSIS — C50911 Malignant neoplasm of unspecified site of right female breast: Secondary | ICD-10-CM | POA: Diagnosis not present

## 2016-04-21 DIAGNOSIS — C7951 Secondary malignant neoplasm of bone: Secondary | ICD-10-CM | POA: Insufficient documentation

## 2016-04-21 MED ORDER — TECHNETIUM TC 99M MEDRONATE IV KIT
25.0000 | PACK | Freq: Once | INTRAVENOUS | Status: AC | PRN
  Administered 2016-04-21: 25 via INTRAVENOUS

## 2016-04-24 ENCOUNTER — Telehealth: Payer: Self-pay | Admitting: *Deleted

## 2016-04-24 NOTE — Telephone Encounter (Signed)
Telephone call to patient- advised results. Patient confirms she will follow up as scheduled.

## 2016-04-24 NOTE — Telephone Encounter (Signed)
-----   Message from Ladell Pier, MD sent at 04/23/2016  5:52 PM EDT ----- Please call patient, bone scan is stable, f/u as scheduled

## 2016-05-29 ENCOUNTER — Other Ambulatory Visit: Payer: Self-pay | Admitting: Oncology

## 2016-05-29 DIAGNOSIS — C50919 Malignant neoplasm of unspecified site of unspecified female breast: Secondary | ICD-10-CM

## 2016-05-29 DIAGNOSIS — C7951 Secondary malignant neoplasm of bone: Principal | ICD-10-CM

## 2016-06-30 ENCOUNTER — Telehealth: Payer: Self-pay | Admitting: Oncology

## 2016-06-30 ENCOUNTER — Other Ambulatory Visit (HOSPITAL_BASED_OUTPATIENT_CLINIC_OR_DEPARTMENT_OTHER): Payer: Medicare Other

## 2016-06-30 ENCOUNTER — Ambulatory Visit (HOSPITAL_BASED_OUTPATIENT_CLINIC_OR_DEPARTMENT_OTHER): Payer: Medicare Other

## 2016-06-30 ENCOUNTER — Ambulatory Visit (HOSPITAL_BASED_OUTPATIENT_CLINIC_OR_DEPARTMENT_OTHER): Payer: Medicare Other | Admitting: Oncology

## 2016-06-30 VITALS — BP 139/75 | HR 68 | Temp 98.6°F | Resp 18 | Ht 62.0 in | Wt 149.1 lb

## 2016-06-30 DIAGNOSIS — C7951 Secondary malignant neoplasm of bone: Secondary | ICD-10-CM

## 2016-06-30 DIAGNOSIS — C50911 Malignant neoplasm of unspecified site of right female breast: Secondary | ICD-10-CM | POA: Diagnosis not present

## 2016-06-30 DIAGNOSIS — M25551 Pain in right hip: Secondary | ICD-10-CM | POA: Diagnosis not present

## 2016-06-30 LAB — BASIC METABOLIC PANEL
Anion Gap: 13 mEq/L — ABNORMAL HIGH (ref 3–11)
BUN: 16.3 mg/dL (ref 7.0–26.0)
CO2: 26 meq/L (ref 22–29)
CREATININE: 0.8 mg/dL (ref 0.6–1.1)
Calcium: 9.6 mg/dL (ref 8.4–10.4)
Chloride: 103 mEq/L (ref 98–109)
EGFR: 76 mL/min/{1.73_m2} — ABNORMAL LOW (ref >90)
GLUCOSE: 267 mg/dL — AB (ref 70–140)
Potassium: 3.7 mEq/L (ref 3.5–5.1)
Sodium: 142 mEq/L (ref 136–145)

## 2016-06-30 MED ORDER — SODIUM CHLORIDE 0.9 % IV SOLN
INTRAVENOUS | Status: DC
  Administered 2016-06-30: 10:00:00 via INTRAVENOUS

## 2016-06-30 MED ORDER — ZOLEDRONIC ACID 4 MG/100ML IV SOLN
4.0000 mg | Freq: Once | INTRAVENOUS | Status: AC
  Administered 2016-06-30: 4 mg via INTRAVENOUS
  Filled 2016-06-30: qty 100

## 2016-06-30 NOTE — Telephone Encounter (Signed)
Scheduled appt per 6/22 los - patient is aware - will get schedule from Rn in the treatment area

## 2016-06-30 NOTE — Patient Instructions (Signed)

## 2016-06-30 NOTE — Progress Notes (Signed)
[place] OFFICE PROGRESS NOTE   Diagnosis: Breast cancer  INTERVAL HISTORY:   Ms. Cindy Frazier returns as scheduled. She continues Arimidex. A bone scan 04/22/2016 revealed stable activity at the right acetabulum. No other evidence of metastatic disease. She feels well. She had aching in the hip areas during the month of May. This has improved. Mild chronic discomfort at the right hip. This has not changed. She has noted intermittent discomfort at the dorsum of the right hand. No severe pain. No tooth pain or loosening.  Objective:  Vital signs in last 24 hours:  Blood pressure 139/75, pulse 68, temperature 98.6 F (37 C), temperature source Oral, resp. rate 18, height '5\' 2"'$  (1.575 m), weight 149 lb 1.6 oz (67.6 kg), SpO2 100 %.    HEENT: Neck without mass Lymphatics: No cervical, supraclavicular, or axillary nodes Resp: Lungs clear bilaterally Cardio: Regular rate and rhythm GI: No hepatomegaly, nontender Vascular: No leg edema Breasts: Status post right mastectomy with a TRAM reconstruction. No evidence for chest wall tumor recurrence. Left breast without mass Musculoskeletal: No pain with flexion or internal/external rotation of the right hip, examination of the right hand is unremarkable  Lab Results:   CMP     Component Value Date/Time   NA 142 06/30/2016 0809   K 3.7 06/30/2016 0809   CL 100 04/24/2014 1112   CO2 26 06/30/2016 0809   GLUCOSE 267 (H) 06/30/2016 0809   BUN 16.3 06/30/2016 0809   CREATININE 0.8 06/30/2016 0809   CALCIUM 9.6 06/30/2016 0809   PROT 7.6 12/31/2015 1200   ALBUMIN 4.0 12/31/2015 1200   AST 14 12/31/2015 1200   ALT 22 12/31/2015 1200   ALKPHOS 57 12/31/2015 1200   BILITOT 0.37 12/31/2015 1200   GFRNONAA 89 (L) 04/24/2014 1112   GFRAA >90 04/24/2014 1112    Medications: I have reviewed the patient's current medications.  Assessment/Plan: 1. Breast cancer-stage IIA (T2 N0) right breast cancer diagnosed in January 2000,  status post a right mastectomy and TRAM reconstruction. She completed adjuvant chemotherapy followed by 5 years of tamoxifen and then 5 years of Femara. She completed Femara at the end of July 2010.  2. Right groin pain with an MRI and bone scan February 2015 concerning for metastatic disease involving the right acetabulum/ileum.  Cycle 1 Zometa 03/21/2013   Status post SBRT to the right acetabulum completed 03/26/2013   Initiation of Arimidex 03/27/2013.  Bone scan 09/23/2013. Metastatic disease in the right hemipelvis. Overall degree of activity decreased when compared to the prior exam.  Bone scan 04/21/2014. Right pelvic/acetabular uptake slightly decreased since the prior exam.  Bone scan 10/02/2014-unchanged metastasis at the right acetabulum, no new metastases, activity at the right greater than left mandible felt to reflect dental disease  Bone scan 06/18/2015-no change in the right acetabulum metastasis. No new metastases.    Bone scan 04/22/2016-no change in right acetabulum metastasis, no new metastases 3. Status post CT guided right superior acetabular bone biopsy on 02/28/2013. Pathology showed metastatic carcinoma consistent with breast primary; ER positive (90-100%); PR weakly positive (5-10%); HER-2/neu not amplified; Ki-67 20%. 4. CT chest/abdomen/pelvis 02/28/2013 with mild thyroid enlargement and multiple low-attenuation lesions probably due to multinodular goiter; suggestion of asymmetry in the deep lateral central left breast; cholelithiasis; small left renal cysts; degenerative changes throughout the spine; no lymphadenopathy in the abdomen or pelvis; no concerning solid organ lesions; mixed lytic and sclerotic destructive lesion of the right acetabulum. 5. History of Elevated calcium level-likely related  to taking a potassium supplement prior to the blood draws    Disposition:  Ms. [name] appears stable. She will continue Arimidex. She will receive Zometa today  and again in 3 months. She will return for an office visit in 6 months. She will schedule a left mammogram for later this year.  15 minutes were spent with the patient today. The majority of the time was used for counseling and coordination of care.  Donneta Romberg, MD  06/30/2016  9:15 AM

## 2016-07-03 ENCOUNTER — Other Ambulatory Visit: Payer: Self-pay | Admitting: Oncology

## 2016-07-03 DIAGNOSIS — C7951 Secondary malignant neoplasm of bone: Principal | ICD-10-CM

## 2016-07-03 DIAGNOSIS — C50919 Malignant neoplasm of unspecified site of unspecified female breast: Secondary | ICD-10-CM

## 2016-08-30 ENCOUNTER — Telehealth: Payer: Self-pay | Admitting: Oncology

## 2016-08-30 NOTE — Telephone Encounter (Signed)
Left voicemail for patient regarding moving her 9/27 appts to 10/4. Will be sending her a reminder letter.

## 2016-09-29 ENCOUNTER — Other Ambulatory Visit: Payer: Medicare Other

## 2016-09-29 ENCOUNTER — Ambulatory Visit: Payer: Medicare Other

## 2016-10-05 ENCOUNTER — Ambulatory Visit: Payer: Medicare Other

## 2016-10-05 ENCOUNTER — Other Ambulatory Visit: Payer: Medicare Other

## 2016-10-12 ENCOUNTER — Ambulatory Visit (HOSPITAL_BASED_OUTPATIENT_CLINIC_OR_DEPARTMENT_OTHER): Payer: Medicare Other

## 2016-10-12 ENCOUNTER — Other Ambulatory Visit (HOSPITAL_BASED_OUTPATIENT_CLINIC_OR_DEPARTMENT_OTHER): Payer: Medicare Other

## 2016-10-12 VITALS — BP 144/70 | HR 71 | Temp 98.5°F | Resp 17

## 2016-10-12 DIAGNOSIS — C7951 Secondary malignant neoplasm of bone: Secondary | ICD-10-CM

## 2016-10-12 DIAGNOSIS — C50911 Malignant neoplasm of unspecified site of right female breast: Secondary | ICD-10-CM | POA: Diagnosis not present

## 2016-10-12 LAB — BASIC METABOLIC PANEL
Anion Gap: 12 mEq/L — ABNORMAL HIGH (ref 3–11)
BUN: 18.2 mg/dL (ref 7.0–26.0)
CALCIUM: 10.1 mg/dL (ref 8.4–10.4)
CHLORIDE: 102 meq/L (ref 98–109)
CO2: 26 meq/L (ref 22–29)
Creatinine: 0.8 mg/dL (ref 0.6–1.1)
EGFR: 74 mL/min/{1.73_m2} — AB (ref >90)
GLUCOSE: 165 mg/dL — AB (ref 70–140)
POTASSIUM: 3.7 meq/L (ref 3.5–5.1)
SODIUM: 139 meq/L (ref 136–145)

## 2016-10-12 MED ORDER — ZOLEDRONIC ACID 4 MG/100ML IV SOLN
4.0000 mg | Freq: Once | INTRAVENOUS | Status: AC
  Administered 2016-10-12: 4 mg via INTRAVENOUS
  Filled 2016-10-12: qty 100

## 2016-10-12 NOTE — Patient Instructions (Signed)

## 2016-10-23 ENCOUNTER — Other Ambulatory Visit: Payer: Self-pay | Admitting: Oncology

## 2016-10-23 DIAGNOSIS — Z1231 Encounter for screening mammogram for malignant neoplasm of breast: Secondary | ICD-10-CM

## 2016-12-07 ENCOUNTER — Ambulatory Visit
Admission: RE | Admit: 2016-12-07 | Discharge: 2016-12-07 | Disposition: A | Discharge status: Home / Self Care | Payer: Medicare Other | Source: Ambulatory Visit | Attending: Oncology | Admitting: Oncology

## 2016-12-07 DIAGNOSIS — Z1231 Encounter for screening mammogram for malignant neoplasm of breast: Secondary | ICD-10-CM

## 2016-12-11 ENCOUNTER — Other Ambulatory Visit: Payer: Self-pay | Admitting: Oncology

## 2016-12-11 DIAGNOSIS — Z1231 Encounter for screening mammogram for malignant neoplasm of breast: Secondary | ICD-10-CM

## 2016-12-16 ENCOUNTER — Other Ambulatory Visit: Payer: Self-pay | Admitting: Nurse Practitioner

## 2016-12-29 ENCOUNTER — Ambulatory Visit (HOSPITAL_BASED_OUTPATIENT_CLINIC_OR_DEPARTMENT_OTHER): Payer: Medicare Other | Admitting: Oncology

## 2016-12-29 ENCOUNTER — Encounter: Payer: Self-pay | Admitting: Oncology

## 2016-12-29 ENCOUNTER — Telehealth: Payer: Self-pay | Admitting: Oncology

## 2016-12-29 ENCOUNTER — Other Ambulatory Visit (HOSPITAL_BASED_OUTPATIENT_CLINIC_OR_DEPARTMENT_OTHER): Payer: Medicare Other

## 2016-12-29 ENCOUNTER — Ambulatory Visit (HOSPITAL_BASED_OUTPATIENT_CLINIC_OR_DEPARTMENT_OTHER): Payer: Medicare Other

## 2016-12-29 VITALS — BP 126/62 | HR 85 | Temp 97.8°F | Resp 18 | Ht 62.0 in | Wt 140.7 lb

## 2016-12-29 DIAGNOSIS — C50911 Malignant neoplasm of unspecified site of right female breast: Secondary | ICD-10-CM

## 2016-12-29 DIAGNOSIS — C7951 Secondary malignant neoplasm of bone: Secondary | ICD-10-CM

## 2016-12-29 LAB — BASIC METABOLIC PANEL
ANION GAP: 11 meq/L (ref 3–11)
BUN: 21.7 mg/dL (ref 7.0–26.0)
CALCIUM: 9.6 mg/dL (ref 8.4–10.4)
CO2: 26 mEq/L (ref 22–29)
Chloride: 102 mEq/L (ref 98–109)
Creatinine: 0.9 mg/dL (ref 0.6–1.1)
GLUCOSE: 242 mg/dL — AB (ref 70–140)
POTASSIUM: 3.7 meq/L (ref 3.5–5.1)
Sodium: 139 mEq/L (ref 136–145)

## 2016-12-29 MED ORDER — SODIUM CHLORIDE 0.9 % IV SOLN
Freq: Once | INTRAVENOUS | Status: AC
  Administered 2016-12-29: 10:00:00 via INTRAVENOUS

## 2016-12-29 MED ORDER — ZOLEDRONIC ACID 4 MG/100ML IV SOLN
4.0000 mg | Freq: Once | INTRAVENOUS | Status: AC
  Administered 2016-12-29: 4 mg via INTRAVENOUS
  Filled 2016-12-29: qty 100

## 2016-12-29 NOTE — Progress Notes (Signed)
[facility] OFFICE PROGRESS NOTE   Diagnosis: Breast cancer  INTERVAL HISTORY:   Ms. Calvillo returns as scheduled.  She continues Arimidex.  A left mammogram on 12/07/2016 was negative.  She feels well.  She reports intentional weight loss with dieting and exercise.  No jaw or tooth pain.  No pain at the right hip.  No new sites of pain.  She had mild discomfort at the right TRAM site after exercising.  Objective:  Vital signs in last 24 hours:  Blood pressure 126/62, pulse 85, temperature 97.8 F (36.6 C), temperature source Oral, resp. rate 18, height '5\' 2"'$  (1.575 m), weight 140 lb 11.2 oz (63.8 kg), SpO2 100 %.    HEENT: Neck without mass Lymphatics: No cervical, supraclavicular, or axillary nodes Resp: Lungs clear bilaterally Cardio: Regular rate and rhythm GI: No hepatomegaly Vascular: No leg edema Breast: Status post right mastectomy with a TRAM reconstruction.  No mass at the right chest wall or left breast.  Firm tissue surrounding the left breast reduction scar. Skeletal: No pain with motion at the right hip   CMP     Component Value Date/Time   NA 139 12/29/2016 0740   K 3.7 12/29/2016 0740   CL 100 04/24/2014 1112   CO2 26 12/29/2016 0740   GLUCOSE 242 (H) 12/29/2016 0740   BUN 21.7 12/29/2016 0740   CREATININE 0.9 12/29/2016 0740   CALCIUM 9.6 12/29/2016 0740   PROT 7.6 12/31/2015 1200   ALBUMIN 4.0 12/31/2015 1200   AST 14 12/31/2015 1200   ALT 22 12/31/2015 1200   ALKPHOS 57 12/31/2015 1200   BILITOT 0.37 12/31/2015 1200   GFRNONAA 89 (L) 04/24/2014 1112   GFRAA >90 04/24/2014 1112     Medications: I have reviewed the patient's current medications.   Assessment/Plan: 1. Breast cancer-stage IIA (T2 N0) right breast cancer diagnosed in January 2000, status post a right mastectomy and TRAM reconstruction. She completed adjuvant chemotherapy followed by 5 years of tamoxifen and then 5 years of Femara. She completed Femara at the end of  July 2010.  2. Right groin pain with an MRI and bone scan February 2015 concerning for metastatic disease involving the right acetabulum/ileum.  Cycle 1 Zometa 03/21/2013   Status post SBRT to the right acetabulum completed 03/26/2013   Initiation of Arimidex 03/27/2013.  Bone scan 09/23/2013. Metastatic disease in the right hemipelvis. Overall degree of activity decreased when compared to the prior exam.  Bone scan 04/21/2014. Right pelvic/acetabular uptake slightly decreased since the prior exam.  Bone scan 10/02/2014-unchanged metastasis at the right acetabulum, no new metastases, activity at the right greater than left mandible felt to reflect dental disease  Bone scan 06/18/2015-no change in the right acetabulum metastasis. No new metastases.    Bone scan 04/22/2016-no change in right acetabulum metastasis, no new metastases 3. Status post CT guided right superior acetabular bone biopsy on 02/28/2013. Pathology showed metastatic carcinoma consistent with breast primary; ER positive (90-100%); PR weakly positive (5-10%); HER-2/neu not amplified; Ki-67 20%. 4. CT chest/abdomen/pelvis 02/28/2013 with mild thyroid enlargement and multiple low-attenuation lesions probably due to multinodular goiter; suggestion of asymmetry in the deep lateral central left breast; cholelithiasis; small left renal cysts; degenerative changes throughout the spine; no lymphadenopathy in the abdomen or pelvis; no concerning solid organ lesions; mixed lytic and sclerotic destructive lesion of the right acetabulum. 5. History of Elevated calcium level-likely related to taking a potassium supplement prior to the blood draws  Disposition: Ms. Gaffin appears  well.  There is no clinical evidence of disease progression.  She will continue Arimidex.  She will receive Zometa today.  Bettes will return for Zometa in 3 months and an office visit in 6 months.  We will consider obtaining a restaging bone scan after the  next office visit.  15 minutes were spent with the patient today.  The majority of the time was used for counseling and coordination of care.  Betsy Coder, MD  12/29/2016  9:02 AM

## 2016-12-29 NOTE — Patient Instructions (Signed)

## 2016-12-29 NOTE — Telephone Encounter (Signed)
Scheduled appt per 12/21 los - patient is aware of appt - did not want AVS or calender printed .

## 2017-03-28 ENCOUNTER — Other Ambulatory Visit: Payer: Self-pay | Admitting: Nurse Practitioner

## 2017-03-28 DIAGNOSIS — C50919 Malignant neoplasm of unspecified site of unspecified female breast: Secondary | ICD-10-CM

## 2017-03-28 DIAGNOSIS — C7951 Secondary malignant neoplasm of bone: Principal | ICD-10-CM

## 2017-03-29 ENCOUNTER — Inpatient Hospital Stay: Payer: Medicare Other

## 2017-03-29 ENCOUNTER — Inpatient Hospital Stay: Payer: Medicare Other | Attending: Oncology

## 2017-03-29 VITALS — BP 142/72 | HR 72 | Temp 97.9°F | Resp 16

## 2017-03-29 DIAGNOSIS — Z79899 Other long term (current) drug therapy: Secondary | ICD-10-CM | POA: Diagnosis not present

## 2017-03-29 DIAGNOSIS — C7951 Secondary malignant neoplasm of bone: Secondary | ICD-10-CM

## 2017-03-29 DIAGNOSIS — Z170 Estrogen receptor positive status [ER+]: Secondary | ICD-10-CM | POA: Diagnosis present

## 2017-03-29 DIAGNOSIS — C50911 Malignant neoplasm of unspecified site of right female breast: Secondary | ICD-10-CM | POA: Diagnosis present

## 2017-03-29 DIAGNOSIS — C50919 Malignant neoplasm of unspecified site of unspecified female breast: Secondary | ICD-10-CM

## 2017-03-29 LAB — BASIC METABOLIC PANEL - CANCER CENTER ONLY
Anion gap: 10 (ref 3–11)
BUN: 22 mg/dL (ref 7–26)
CALCIUM: 10.1 mg/dL (ref 8.4–10.4)
CO2: 27 mmol/L (ref 22–29)
CREATININE: 0.87 mg/dL (ref 0.60–1.10)
Chloride: 102 mmol/L (ref 98–109)
GFR, Est AFR Am: >60 mL/min (ref >60)
GLUCOSE: 213 mg/dL — AB (ref 70–140)
Potassium: 4.1 mmol/L (ref 3.5–5.1)
Sodium: 139 mmol/L (ref 136–145)

## 2017-03-29 MED ORDER — ZOLEDRONIC ACID 4 MG/100ML IV SOLN
4.0000 mg | Freq: Once | INTRAVENOUS | Status: AC
  Administered 2017-03-29: 4 mg via INTRAVENOUS
  Filled 2017-03-29: qty 100

## 2017-03-29 NOTE — Patient Instructions (Signed)

## 2017-05-28 ENCOUNTER — Telehealth: Payer: Self-pay

## 2017-05-28 NOTE — Telephone Encounter (Addendum)
Pt called to inquire if lab/MD/infusion appts for 6/21 can be pushed to the next week due to her being on vacation. Will consult MD. Per Dr. Benay Spice, ok to move appts 1 week. Message sent to scheduling department. Pt voiced understanding.

## 2017-05-29 ENCOUNTER — Telehealth: Payer: Self-pay | Admitting: Oncology

## 2017-05-29 NOTE — Telephone Encounter (Signed)
R/s appt per 5/20 sch message - pt aware of appt date and time.

## 2017-06-29 ENCOUNTER — Ambulatory Visit: Payer: Medicare Other

## 2017-06-29 ENCOUNTER — Ambulatory Visit: Payer: Medicare Other | Admitting: Oncology

## 2017-06-29 ENCOUNTER — Other Ambulatory Visit: Payer: Medicare Other

## 2017-07-05 ENCOUNTER — Other Ambulatory Visit: Payer: Self-pay | Admitting: *Deleted

## 2017-07-05 DIAGNOSIS — C50911 Malignant neoplasm of unspecified site of right female breast: Secondary | ICD-10-CM

## 2017-07-05 DIAGNOSIS — C7951 Secondary malignant neoplasm of bone: Principal | ICD-10-CM

## 2017-07-06 ENCOUNTER — Inpatient Hospital Stay: Payer: Medicare Other

## 2017-07-06 ENCOUNTER — Telehealth: Payer: Self-pay

## 2017-07-06 ENCOUNTER — Inpatient Hospital Stay: Payer: Medicare Other | Attending: Oncology

## 2017-07-06 ENCOUNTER — Inpatient Hospital Stay (HOSPITAL_BASED_OUTPATIENT_CLINIC_OR_DEPARTMENT_OTHER): Payer: Medicare Other | Admitting: Oncology

## 2017-07-06 DIAGNOSIS — Z170 Estrogen receptor positive status [ER+]: Secondary | ICD-10-CM | POA: Insufficient documentation

## 2017-07-06 DIAGNOSIS — Z9011 Acquired absence of right breast and nipple: Secondary | ICD-10-CM | POA: Insufficient documentation

## 2017-07-06 DIAGNOSIS — R200 Anesthesia of skin: Secondary | ICD-10-CM

## 2017-07-06 DIAGNOSIS — C50911 Malignant neoplasm of unspecified site of right female breast: Secondary | ICD-10-CM

## 2017-07-06 DIAGNOSIS — Z79811 Long term (current) use of aromatase inhibitors: Secondary | ICD-10-CM

## 2017-07-06 DIAGNOSIS — E079 Disorder of thyroid, unspecified: Secondary | ICD-10-CM | POA: Insufficient documentation

## 2017-07-06 DIAGNOSIS — C7951 Secondary malignant neoplasm of bone: Principal | ICD-10-CM

## 2017-07-06 DIAGNOSIS — Z79899 Other long term (current) drug therapy: Secondary | ICD-10-CM

## 2017-07-06 DIAGNOSIS — C50919 Malignant neoplasm of unspecified site of unspecified female breast: Secondary | ICD-10-CM

## 2017-07-06 LAB — BASIC METABOLIC PANEL
Anion gap: 16 — ABNORMAL HIGH (ref 5–15)
BUN: 25 mg/dL — AB (ref 8–23)
CALCIUM: 10.2 mg/dL (ref 8.9–10.3)
CO2: 25 mmol/L (ref 22–32)
CREATININE: 0.93 mg/dL (ref 0.44–1.00)
Chloride: 101 mmol/L (ref 98–111)
GFR calc non Af Amer: >60 mL/min (ref >60)
Glucose, Bld: 149 mg/dL — ABNORMAL HIGH (ref 70–99)
Potassium: 4 mmol/L (ref 3.5–5.1)
Sodium: 142 mmol/L (ref 135–145)

## 2017-07-06 MED ORDER — ZOLEDRONIC ACID 4 MG/100ML IV SOLN
4.0000 mg | Freq: Once | INTRAVENOUS | Status: AC
  Administered 2017-07-06: 4 mg via INTRAVENOUS
  Filled 2017-07-06: qty 100

## 2017-07-06 MED ORDER — ANASTROZOLE 1 MG PO TABS: 1.0000 mg | ORAL_TABLET | Freq: Every day | ORAL | 11 refills | Status: DC

## 2017-07-06 NOTE — Patient Instructions (Signed)

## 2017-07-06 NOTE — Telephone Encounter (Signed)
Printed avs and calender of upcoming appointment. Per 6/28 los also gave patient contrast, instructions, and CT number

## 2017-07-06 NOTE — Progress Notes (Signed)
  [place] OFFICE PROGRESS NOTE   Diagnosis: Breast cancer  INTERVAL HISTORY:   Ms. Cindy Frazier returns as scheduled.  She continues Arimidex.  She feels well.  Good appetite and energy level.  No pain.  She has noted intermittent numbness in the right hand for the past 6 months.  The numbness last seconds to minutes and usually occurs in the mornings.  No weakness.  No leg symptoms.  No neck pain.  Objective:  Vital signs in last 24 hours:  Blood pressure 131/73, pulse 76, temperature 98.2 F (36.8 C), temperature source Oral, resp. rate 18, height '5\' 2"'$  (1.575 m), weight 144 lb (65.3 kg), SpO2 100 %.    HEENT: Neck without mass or tenderness Lymphatics: No cervical, supraclavicular, or axillary nodes Resp: Lungs clear bilaterally Cardio: Regular rate and rhythm GI: No hepatomegaly, nontender Vascular: No leg edema Neuro: The motor exam is intact in the right arm and hand Breast: Status post right mastectomy with a TRAM reconstruction.  No evidence for chest wall tumor recurrence.  Left breast without mass.   Medications: I have reviewed the patient's current medications.   Assessment/Plan: 1. Breast cancer-stage IIA (T2 N0) right breast cancer diagnosed in January 2000, status post a right mastectomy and TRAM reconstruction. She completed adjuvant chemotherapy followed by 5 years of tamoxifen and then 5 years of Femara. She completed Femara at the end of July 2010.  2. Right groin pain with an MRI and bone scan February 2015 concerning for metastatic disease involving the right acetabulum/ileum.  Cycle 1 Zometa 03/21/2013   Status post SBRT to the right acetabulum completed 03/26/2013   Initiation of Arimidex 03/27/2013.  Bone scan 09/23/2013. Metastatic disease in the right hemipelvis. Overall degree of activity decreased when compared to the prior exam.  Bone scan 04/21/2014. Right pelvic/acetabular uptake slightly decreased since the prior exam.  Bone  scan 10/02/2014-unchanged metastasis at the right acetabulum, no new metastases, activity at the right greater than left mandible felt to reflect dental disease  Bone scan 06/18/2015-no change in the right acetabulum metastasis. No new metastases.  Bonescan 04/22/2016-no change in right acetabulum metastasis, no new metastases 3. Status post CT guided right superior acetabular bone biopsy on 02/28/2013. Pathology showed metastatic carcinoma consistent with breast primary; ER positive (90-100%); PR weakly positive (5-10%); HER-2/neu not amplified; Ki-67 20%. 4. CT chest/abdomen/pelvis 02/28/2013 with mild thyroid enlargement and multiple low-attenuation lesions probably due to multinodular goiter; suggestion of asymmetry in the deep lateral central left breast; cholelithiasis; small left renal cysts; degenerative changes throughout the spine; no lymphadenopathy in the abdomen or pelvis; no concerning solid organ lesions; mixed lytic and sclerotic destructive lesion of the right acetabulum. 5. History of Elevated calcium level-likely related to taking a potassium supplement prior to the blood draws  Disposition: Cindy Frazier appears stable.  No clinical evidence for progression of breast cancer.  She will continue Arimidex.  She continues every 3 months Zometa. She will contact us if the right hand numbness becomes more consistent.  We discussed the indication for restaging scans.  She will be scheduled for restaging CTs when she receives Zometa in 3 months.  She will return for an office visit in 6 months.  Betsy Coder, MD  07/06/2017  9:08 AM

## 2017-07-31 ENCOUNTER — Other Ambulatory Visit: Payer: Self-pay | Admitting: Oncology

## 2017-07-31 DIAGNOSIS — R5381 Other malaise: Secondary | ICD-10-CM

## 2017-08-01 ENCOUNTER — Telehealth: Payer: Self-pay | Admitting: *Deleted

## 2017-08-01 NOTE — Telephone Encounter (Signed)
Patient called triage today with concerns of a lump that she found on her left breast this weekend. She called to schedule her mammogram and they stated she needed to see the dr. Brantley Stage. Appt scheduled ok per Lacie for 7/25 at 10am

## 2017-08-03 ENCOUNTER — Encounter: Payer: Self-pay | Admitting: Nurse Practitioner

## 2017-08-03 ENCOUNTER — Inpatient Hospital Stay: Payer: Medicare Other | Attending: Oncology | Admitting: Nurse Practitioner

## 2017-08-03 ENCOUNTER — Telehealth: Payer: Self-pay | Admitting: Hematology

## 2017-08-03 VITALS — BP 137/73 | HR 84 | Temp 98.5°F | Resp 18 | Ht 62.0 in | Wt 142.6 lb

## 2017-08-03 DIAGNOSIS — E119 Type 2 diabetes mellitus without complications: Secondary | ICD-10-CM | POA: Diagnosis not present

## 2017-08-03 DIAGNOSIS — E785 Hyperlipidemia, unspecified: Secondary | ICD-10-CM | POA: Diagnosis not present

## 2017-08-03 DIAGNOSIS — C7951 Secondary malignant neoplasm of bone: Secondary | ICD-10-CM | POA: Diagnosis not present

## 2017-08-03 DIAGNOSIS — I10 Essential (primary) hypertension: Secondary | ICD-10-CM | POA: Diagnosis not present

## 2017-08-03 DIAGNOSIS — Z170 Estrogen receptor positive status [ER+]: Secondary | ICD-10-CM | POA: Diagnosis not present

## 2017-08-03 DIAGNOSIS — Z9011 Acquired absence of right breast and nipple: Secondary | ICD-10-CM | POA: Insufficient documentation

## 2017-08-03 DIAGNOSIS — Z79899 Other long term (current) drug therapy: Secondary | ICD-10-CM | POA: Diagnosis not present

## 2017-08-03 DIAGNOSIS — N63 Unspecified lump in unspecified breast: Secondary | ICD-10-CM

## 2017-08-03 DIAGNOSIS — C50911 Malignant neoplasm of unspecified site of right female breast: Secondary | ICD-10-CM | POA: Insufficient documentation

## 2017-08-03 DIAGNOSIS — N6320 Unspecified lump in the left breast, unspecified quadrant: Secondary | ICD-10-CM | POA: Diagnosis not present

## 2017-08-03 DIAGNOSIS — Z79811 Long term (current) use of aromatase inhibitors: Secondary | ICD-10-CM | POA: Diagnosis not present

## 2017-08-03 NOTE — Progress Notes (Addendum)
[facility]  Telephone:([phone]) [phone] [phone]) [phone]  Clinic Follow up Note   Patient Care Team: Kelton Pillar, MD as PCP - General (Family Medicine) 08/03/2017  DIAGNOSIS: Breast cancer   CURRENT THERAPY:  1. Anastrozole 1 mg daily 2. Zometa q3 months   INTERVAL HISTORY: Cindy Frazier presents for concern for left breast lump. She was folding laundry 5 days ago and felt a small lump below port a cath scar on left chest. Intermittent soreness. No erythema or drainage. Denies nipple discharge or inversion. Denies injury to the area. Otherwise she has no concerns for new/worsening bone pain. Appetite is normal. BM are loose on janumet but at baseline. Denies fever, chills, cough, chest pain, or dyspnea. She continues Arimidex daily without significant hot flash or joint pain.   REVIEW OF SYSTEMS:   Constitutional: Denies fevers, chills or abnormal weight loss Respiratory: Denies cough, dyspnea or wheezes Cardiovascular: Denies palpitation, chest discomfort or lower extremity swelling Gastrointestinal:  Denies nausea, vomiting, constipation, diarrhea, heartburn or change in bowel habits (+) loose stool at baseline  Skin: Denies abnormal skin rashes Lymphatics: Denies new lymphadenopathy MSK: Denies new or worsening bone/joint pain  Breast: (+) new lump with intermittent soreness to left chest/breast below PAC scar   All other systems were reviewed with the patient and are negative.  MEDICAL HISTORY:  Past Medical History:  Diagnosis Date  . Bone cancer ([place])   . Breast cancer ([place]) 01/1998   Stage II-A Right sided  . Diabetes mellitus without complication ([place])   . Hyperlipidemia   . Hypertension     SURGICAL HISTORY: Past Surgical History:  Procedure Laterality Date  . APPENDECTOMY    . MASTECTOMY Right    malignant, reconstruction Tram flap    I have reviewed the social history and family history with the patient and they are unchanged from previous  note.  ALLERGIES:  is allergic to erythromycin.  MEDICATIONS:  Current Outpatient Medications  Medication Sig Dispense Refill  . anastrozole (ARIMIDEX) 1 MG tablet Take 1 tablet (1 mg total) by mouth daily. 30 tablet 11  . Calcium Carbonate-Vitamin D (CALCIUM + D PO) Take 1 tablet by mouth daily.    . CRESTOR 20 MG tablet Take 20 mg by mouth Daily.    Marland Kitchen ibuprofen (ADVIL,MOTRIN) 200 MG tablet Take 400 mg by mouth every 6 (six) hours as needed.    . INVOKANA 300 MG TABS Take 300 mg by mouth daily.    Marland Kitchen JANUMET XR 50-1000 MG TB24 Take 2 tablets by mouth daily.     Marland Kitchen latanoprost (XALATAN) 0.005 % ophthalmic solution     . losartan-hydrochlorothiazide (HYZAAR) 100-25 MG per tablet Take 1 tablet by mouth Daily.     No current facility-administered medications for this visit.     PHYSICAL EXAMINATION: ECOG PERFORMANCE STATUS: 1 - Symptomatic but completely ambulatory  Vitals:   08/03/17 0956  BP: 137/73  Pulse: 84  Resp: 18  Temp: 98.5 F (36.9 C)  SpO2: 100%   Filed Weights   08/03/17 0956  Weight: 142 lb 9.6 oz (64.7 kg)    GENERAL:alert, no distress and comfortable SKIN: no rashes or significant lesions EYES:  sclera clear LYMPH:  no palpable cervical, supraclavicular, or axillary lymphadenopathy LUNGS: clear to auscultation with normal breathing effort HEART: regular rate & rhythm; no lower extremity edema ABDOMEN:abdomen soft, non-tender and normal bowel sounds Musculoskeletal:no cyanosis of digits and no clubbing  NEURO: alert & oriented x 3 with fluent speech Breast:  inspection reveals s/p right mastectomy with TRAM reconstruction and left augmentation. Incisions are well healed without nodularity. No palpable mass in right breast or axilla. There is a small 4-5 mm round, non-fixed nodule inferior to the PAC scar at the medial edge, no other palpable mass in left breast or axilla.   LABORATORY DATA:  I have reviewed the data as listed CBC Latest Ref Rng & Units  02/21/2013  WBC 3.9 - 10.3 10e3/uL 7.2  Hemoglobin 11.6 - 15.9 g/dL 13.8  Hematocrit 34.8 - 46.6 % 43.9  Platelets 145 - 400 10e3/uL 291     CMP Latest Ref Rng & Units 07/06/2017 03/29/2017 12/29/2016  Glucose 70 - 99 mg/dL 149(H) 213(H) 242(H)  BUN 8 - 23 mg/dL 25(H) 22 21.7  Creatinine 0.44 - 1.00 mg/dL 0.93 0.87 0.9  Sodium 135 - 145 mmol/L 142 139 139  Potassium 3.5 - 5.1 mmol/L 4.0 4.1 3.7  Chloride 98 - 111 mmol/L 101 102 -  CO2 22 - 32 mmol/L _0 Calcium 8.9 - 10.3 mg/dL 10.2 10.1 9.6  Total Protein 6.4 - 8.3 g/dL - - -  Total Bilirubin 0.20 - 1.20 mg/dL - - -  Alkaline Phos 40 - 150 U/L - - -  AST 5 - 34 U/L - - -  ALT 0 - 55 U/L - - -    RADIOGRAPHIC STUDIES: I have personally reviewed the radiological images as listed and agreed with the findings in the report. No results found.   ASSESSMENT & PLAN:  1. Breast cancer-stage IIA (T2 N0) right breast cancer diagnosed in January 2000, status post a right mastectomy and TRAM reconstruction. She completed adjuvant chemotherapy followed by 5 years of tamoxifen and then 5 years of Femara. She completed Femara at the end of July 2010.  2. Right groin pain with an MRI and bone scan February 2015 concerning for metastatic disease involving the right acetabulum/ileum.  Cycle 1 Zometa 03/21/2013   Status post SBRT to the right acetabulum completed 03/26/2013   Initiation of Arimidex 03/27/2013.  Bone scan 09/23/2013. Metastatic disease in the right hemipelvis. Overall degree of activity decreased when compared to the prior exam.  Bone scan 04/21/2014. Right pelvic/acetabular uptake slightly decreased since the prior exam.  Bone scan 10/02/2014-unchanged metastasis at the right acetabulum, no new metastases, activity at the right greater than left mandible felt to reflect dental disease  Bone scan 06/18/2015-no change in the right acetabulum metastasis. No new metastases.  Bonescan 04/22/2016-no change in right  acetabulum metastasis, no new metastases 3. Status post CT guided right superior acetabular bone biopsy on 02/28/2013. Pathology showed metastatic carcinoma consistent with breast primary; ER positive (90-100%); PR weakly positive (5-10%); HER-2/neu not amplified; Ki-67 20%. 4. CT chest/abdomen/pelvis 02/28/2013 with mild thyroid enlargement and multiple low-attenuation lesions probably due to multinodular goiter; suggestion of asymmetry in the deep lateral central left breast; cholelithiasis; small left renal cysts; degenerative changes throughout the spine; no lymphadenopathy in the abdomen or pelvis; no concerning solid organ lesions; mixed lytic and sclerotic destructive lesion of the right acetabulum. 5. History of Elevated calcium level-likely related to taking a potassium supplement prior to the blood draws 6. 4-5 mm round non-fixed nodule, inferior to PAC scar at medial edge, ? Cyst vs lipoma vs scar tissue   Disposition: Cindy Frazier is clinically doing well. She has a small moveable nodule below the PAC scar that is likely a cyst vs lipoma vs scar tissue. It is not felt to be  related to breast cancer. The patient was seen with Dr. Benay Spice who recommends to monitor the area once every 2 weeks and notify if it becomes larger or changes. Certainly she will call if she has new areas of concern. She will be due Zometa in 09/2017 and routine annual mammogram in 11/2017. She will return for routine f/u in 12/2017.   All questions were answered. The patient knows to call the clinic with any problems, questions or concerns. No barriers to learning was detected.     Alla Feeling, NP 08/03/17   This was a shared visit with Cira Rue.  Cindy Frazier was interviewed and examined. There is a 4-5 mm oval mobile cutaneous lesion near the lateral aspect of the left Port-A-Cath scar.  This is likely a cyst or other benign lesion.  She will contact us if this area changes.  Julieanne Manson, MD

## 2017-08-03 NOTE — Telephone Encounter (Signed)
No LOS 7/26 °

## 2017-10-05 ENCOUNTER — Ambulatory Visit (HOSPITAL_COMMUNITY): Payer: Medicare Other

## 2017-10-05 ENCOUNTER — Inpatient Hospital Stay: Payer: Medicare Other

## 2017-10-05 ENCOUNTER — Inpatient Hospital Stay: Payer: Medicare Other | Attending: Oncology

## 2017-10-05 VITALS — BP 121/70 | HR 84 | Resp 18

## 2017-10-05 DIAGNOSIS — C7951 Secondary malignant neoplasm of bone: Secondary | ICD-10-CM | POA: Diagnosis not present

## 2017-10-05 DIAGNOSIS — Z79899 Other long term (current) drug therapy: Secondary | ICD-10-CM | POA: Diagnosis not present

## 2017-10-05 DIAGNOSIS — C50911 Malignant neoplasm of unspecified site of right female breast: Secondary | ICD-10-CM

## 2017-10-05 DIAGNOSIS — C50919 Malignant neoplasm of unspecified site of unspecified female breast: Secondary | ICD-10-CM

## 2017-10-05 LAB — BASIC METABOLIC PANEL - CANCER CENTER ONLY
Anion gap: 12 (ref 5–15)
BUN: 23 mg/dL (ref 8–23)
CALCIUM: 10.5 mg/dL — AB (ref 8.9–10.3)
CO2: 27 mmol/L (ref 22–32)
CREATININE: 0.78 mg/dL (ref 0.44–1.00)
Chloride: 101 mmol/L (ref 98–111)
GFR, Estimated: >60 mL/min (ref >60)
GLUCOSE: 108 mg/dL — AB (ref 70–99)
Potassium: 4.5 mmol/L (ref 3.5–5.1)
Sodium: 140 mmol/L (ref 135–145)

## 2017-10-05 MED ORDER — ZOLEDRONIC ACID 4 MG/100ML IV SOLN
4.0000 mg | Freq: Once | INTRAVENOUS | Status: AC
  Administered 2017-10-05: 4 mg via INTRAVENOUS
  Filled 2017-10-05: qty 100

## 2017-10-05 MED ORDER — SODIUM CHLORIDE 0.9 % IV SOLN
Freq: Once | INTRAVENOUS | Status: AC
  Administered 2017-10-05: 10:00:00 via INTRAVENOUS
  Filled 2017-10-05: qty 250

## 2017-10-05 NOTE — Patient Instructions (Signed)

## 2017-10-06 LAB — CANCER ANTIGEN 27.29: CA 27.29: 8.6 U/mL (ref 0.0–38.6)

## 2017-11-05 ENCOUNTER — Other Ambulatory Visit: Payer: Self-pay | Admitting: Oncology

## 2017-11-05 DIAGNOSIS — Z1231 Encounter for screening mammogram for malignant neoplasm of breast: Secondary | ICD-10-CM

## 2017-11-16 ENCOUNTER — Ambulatory Visit (HOSPITAL_COMMUNITY)
Admission: RE | Admit: 2017-11-16 | Discharge: 2017-11-16 | Disposition: A | Discharge status: Home / Self Care | Payer: Medicare Other | Source: Ambulatory Visit | Attending: Oncology | Admitting: Oncology

## 2017-11-16 ENCOUNTER — Encounter (HOSPITAL_COMMUNITY): Payer: Self-pay

## 2017-11-16 DIAGNOSIS — C50919 Malignant neoplasm of unspecified site of unspecified female breast: Secondary | ICD-10-CM | POA: Insufficient documentation

## 2017-11-16 DIAGNOSIS — C7951 Secondary malignant neoplasm of bone: Secondary | ICD-10-CM | POA: Insufficient documentation

## 2017-11-16 MED ORDER — IOHEXOL 300 MG/ML  SOLN: 100.0000 mL | Freq: Once | INTRAMUSCULAR | Status: DC | PRN

## 2017-11-16 MED ORDER — SODIUM CHLORIDE (PF) 0.9 % IJ SOLN
INTRAMUSCULAR | Status: AC
  Filled 2017-11-16: qty 50

## 2017-12-18 ENCOUNTER — Ambulatory Visit
Admission: RE | Admit: 2017-12-18 | Discharge: 2017-12-18 | Disposition: A | Discharge status: Home / Self Care | Payer: Medicare Other | Source: Ambulatory Visit | Attending: Oncology | Admitting: Oncology

## 2017-12-18 ENCOUNTER — Other Ambulatory Visit: Payer: Self-pay | Admitting: Oncology

## 2017-12-18 DIAGNOSIS — Z1231 Encounter for screening mammogram for malignant neoplasm of breast: Secondary | ICD-10-CM

## 2017-12-18 HISTORY — DX: Personal history of antineoplastic chemotherapy: Z92.21

## 2017-12-19 ENCOUNTER — Other Ambulatory Visit: Payer: Self-pay | Admitting: Oncology

## 2017-12-19 DIAGNOSIS — R928 Other abnormal and inconclusive findings on diagnostic imaging of breast: Secondary | ICD-10-CM

## 2017-12-21 ENCOUNTER — Ambulatory Visit
Admission: RE | Admit: 2017-12-21 | Discharge: 2017-12-21 | Disposition: A | Discharge status: Home / Self Care | Payer: Medicare Other | Source: Ambulatory Visit | Attending: Oncology | Admitting: Oncology

## 2017-12-21 ENCOUNTER — Ambulatory Visit: Payer: Medicare Other

## 2017-12-21 DIAGNOSIS — R928 Other abnormal and inconclusive findings on diagnostic imaging of breast: Secondary | ICD-10-CM

## 2017-12-28 ENCOUNTER — Telehealth: Payer: Self-pay | Admitting: Oncology

## 2017-12-28 ENCOUNTER — Inpatient Hospital Stay: Payer: Medicare Other

## 2017-12-28 ENCOUNTER — Inpatient Hospital Stay: Payer: Medicare Other | Admitting: *Deleted

## 2017-12-28 ENCOUNTER — Ambulatory Visit (HOSPITAL_COMMUNITY)
Admission: RE | Admit: 2017-12-28 | Discharge: 2017-12-28 | Disposition: A | Discharge status: Home / Self Care | Payer: Medicare Other | Source: Ambulatory Visit | Attending: Oncology | Admitting: Oncology

## 2017-12-28 ENCOUNTER — Inpatient Hospital Stay: Payer: Medicare Other | Attending: Oncology | Admitting: Oncology

## 2017-12-28 VITALS — BP 135/77 | HR 86 | Temp 98.4°F | Resp 18 | Ht 62.0 in | Wt 132.0 lb

## 2017-12-28 DIAGNOSIS — N281 Cyst of kidney, acquired: Secondary | ICD-10-CM

## 2017-12-28 DIAGNOSIS — C7951 Secondary malignant neoplasm of bone: Principal | ICD-10-CM

## 2017-12-28 DIAGNOSIS — Z79811 Long term (current) use of aromatase inhibitors: Secondary | ICD-10-CM

## 2017-12-28 DIAGNOSIS — Z170 Estrogen receptor positive status [ER+]: Secondary | ICD-10-CM | POA: Diagnosis not present

## 2017-12-28 DIAGNOSIS — C50919 Malignant neoplasm of unspecified site of unspecified female breast: Secondary | ICD-10-CM | POA: Diagnosis not present

## 2017-12-28 DIAGNOSIS — Z923 Personal history of irradiation: Secondary | ICD-10-CM

## 2017-12-28 DIAGNOSIS — N6489 Other specified disorders of breast: Secondary | ICD-10-CM

## 2017-12-28 DIAGNOSIS — Z9221 Personal history of antineoplastic chemotherapy: Secondary | ICD-10-CM

## 2017-12-28 DIAGNOSIS — C50911 Malignant neoplasm of unspecified site of right female breast: Secondary | ICD-10-CM

## 2017-12-28 DIAGNOSIS — E049 Nontoxic goiter, unspecified: Secondary | ICD-10-CM

## 2017-12-28 LAB — BASIC METABOLIC PANEL - CANCER CENTER ONLY
ANION GAP: 11 (ref 5–15)
BUN: 16 mg/dL (ref 8–23)
CO2: 27 mmol/L (ref 22–32)
Calcium: 10.5 mg/dL — ABNORMAL HIGH (ref 8.9–10.3)
Chloride: 99 mmol/L (ref 98–111)
Creatinine: 0.77 mg/dL (ref 0.44–1.00)
GFR, Estimated: >60 mL/min (ref >60)
Glucose, Bld: 132 mg/dL — ABNORMAL HIGH (ref 70–99)
Potassium: 3.8 mmol/L (ref 3.5–5.1)
Sodium: 137 mmol/L (ref 135–145)

## 2017-12-28 MED ORDER — SODIUM CHLORIDE (PF) 0.9 % IJ SOLN
INTRAMUSCULAR | Status: AC
  Filled 2017-12-28: qty 50

## 2017-12-28 MED ORDER — ZOLEDRONIC ACID 4 MG/100ML IV SOLN
4.0000 mg | Freq: Once | INTRAVENOUS | Status: AC
  Administered 2017-12-28: 4 mg via INTRAVENOUS
  Filled 2017-12-28: qty 100

## 2017-12-28 MED ORDER — IOHEXOL 300 MG/ML  SOLN
100.0000 mL | Freq: Once | INTRAMUSCULAR | Status: AC | PRN
  Administered 2017-12-28: 100 mL via INTRAVENOUS

## 2017-12-28 MED ORDER — SODIUM CHLORIDE 0.9 % IV SOLN
Freq: Once | INTRAVENOUS | Status: AC
  Administered 2017-12-28: 14:00:00 via INTRAVENOUS
  Filled 2017-12-28: qty 250

## 2017-12-28 NOTE — Progress Notes (Signed)
Pt going to CT after this with IV still in place per her request.  Advised to be careful, flushed with blood return noted.

## 2017-12-28 NOTE — Telephone Encounter (Signed)
Scheduled appt per 12/20 los - spoke with patient - pt is aware of appts scheduled - sent reminder letter in the mail with appt date and time

## 2017-12-28 NOTE — Patient Instructions (Signed)

## 2017-12-28 NOTE — Progress Notes (Signed)
[place] OFFICE PROGRESS NOTE   Diagnosis: Breast cancer  INTERVAL HISTORY:   Cindy Frazier returns for a scheduled visit.  She feels well.  She reports intentional weight loss.  She continues Arimidex. She noted discomfort at the bilateral occiput and bitemporal areas over the past few weeks.  This has improved.  She has mild tenderness of the right occiput. A mammogram 12/18/2017 revealed a possible asymmetry in the left breast.  A repeat diagnostic mammogram on 12/21/2017 found resolution of the left breast asymmetry.  She continues every 3 months Zometa.  She reports transient jaw pain after receiving Zometa, but has no consistent discomfort.  Objective:  Vital signs in last 24 hours:  Blood pressure 135/77, pulse 86, temperature 98.4 F (36.9 C), temperature source Oral, resp. rate 18, height _0  (1.575 m), weight 132 lb (59.9 kg), SpO2 100 %.    HEENT: Neck without mass, mild tenderness at the right occiput-no rash or mass in this area Lymphatics: No cervical, supraclavicular, or axillary nodes Resp: Lungs clear bilaterally Cardio: Regular rate and rhythm GI: No hepatomegaly Vascular: No leg edema Breast: Status post right mastectomy with a TRAM reconstruction.  No evidence for chest wall tumor recurrence.  No mass in the left breast.  Soft mobile 5 mm nodular subcutaneous area inferior to the Port-A-Cath scar.  Portacath/PICC-without erythema  Lab Results:  Lab Results  Component Value Date   WBC 7.2 02/21/2013   HGB 13.8 02/21/2013   HCT 43.9 02/21/2013   MCV 83.5 02/21/2013   PLT 291 02/21/2013   NEUTROABS 4.2 02/21/2013    CMP  Lab Results  Component Value Date   NA 137 12/28/2017   K 3.8 12/28/2017   CL 99 12/28/2017   CO2 27 12/28/2017   GLUCOSE 132 (H) 12/28/2017   BUN 16 12/28/2017   CREATININE 0.77 12/28/2017   CALCIUM 10.5 (H) 12/28/2017   PROT 7.6 12/31/2015   ALBUMIN 4.0 12/31/2015   AST 14 12/31/2015   ALT 22 12/31/2015     ALKPHOS 57 12/31/2015   BILITOT 0.37 12/31/2015   GFRNONAA >60 12/28/2017   GFRAA >60 12/28/2017   Medications: I have reviewed the patient's current medications.   Assessment/Plan: 1. Breast cancer-stage IIA (T2 N0) right breast cancer diagnosed in January 2000, status post a right mastectomy and TRAM reconstruction. She completed adjuvant chemotherapy followed by 5 years of tamoxifen and then 5 years of Femara. She completed Femara at the end of July 2010.  2. Right groin pain with an MRI and bone scan February 2015 concerning for metastatic disease involving the right acetabulum/ileum.  Cycle 1 Zometa 03/21/2013   Status post SBRT to the right acetabulum completed 03/26/2013   Initiation of Arimidex 03/27/2013.  Bone scan 09/23/2013. Metastatic disease in the right hemipelvis. Overall degree of activity decreased when compared to the prior exam.  Bone scan 04/21/2014. Right pelvic/acetabular uptake slightly decreased since the prior exam.  Bone scan 10/02/2014-unchanged metastasis at the right acetabulum, no new metastases, activity at the right greater than left mandible felt to reflect dental disease  Bone scan 06/18/2015-no change in the right acetabulum metastasis. No new metastases.  Bonescan 04/22/2016-no change in right acetabulum metastasis, no new metastases 3. Status post CT guided right superior acetabular bone biopsy on 02/28/2013. Pathology showed metastatic carcinoma consistent with breast primary; ER positive (90-100%); PR weakly positive (5-10%); HER-2/neu not amplified; Ki-67 20%. 4. CT chest/abdomen/pelvis 02/28/2013 with mild thyroid enlargement and multiple low-attenuation lesions probably due to  multinodular goiter; suggestion of asymmetry in the deep lateral central left breast; cholelithiasis; small left renal cysts; degenerative changes throughout the spine; no lymphadenopathy in the abdomen or pelvis; no concerning solid organ lesions; mixed lytic and  sclerotic destructive lesion of the right acetabulum. 5. History of Elevated calcium level-likely related to taking a potassium supplement prior to the blood draws    Disposition: Cindy Frazier remains in clinical remission from breast cancer.  She will continue Arimidex.  She has a chronic history of mildly elevated calcium levels, potentially related to an oral calcium supplement.  She will receive Zometa today and in 3 months.  She will return for an office visit in 6 months.  She is scheduled to undergo restaging CTs today.  15 minutes were spent with the patient today.  The majority of the time was used for counseling and coordination of care.  Betsy Coder, MD  12/28/2017  12:35 PM

## 2017-12-31 ENCOUNTER — Other Ambulatory Visit: Payer: Self-pay | Admitting: Oncology

## 2017-12-31 DIAGNOSIS — C7951 Secondary malignant neoplasm of bone: Principal | ICD-10-CM

## 2017-12-31 DIAGNOSIS — C50911 Malignant neoplasm of unspecified site of right female breast: Secondary | ICD-10-CM

## 2018-01-04 NOTE — Progress Notes (Signed)
Bmet faxed to Dr. Kelton Pillar as requested

## 2018-01-11 ENCOUNTER — Ambulatory Visit
Admission: RE | Admit: 2018-01-11 | Discharge: 2018-01-11 | Disposition: A | Discharge status: Home / Self Care | Payer: Medicare Other | Source: Ambulatory Visit | Attending: Physician Assistant | Admitting: Physician Assistant

## 2018-01-11 ENCOUNTER — Other Ambulatory Visit: Payer: Self-pay | Admitting: Physician Assistant

## 2018-01-11 DIAGNOSIS — R519 Headache, unspecified: Secondary | ICD-10-CM

## 2018-01-11 DIAGNOSIS — R51 Headache: Principal | ICD-10-CM

## 2018-01-15 ENCOUNTER — Ambulatory Visit
Admission: RE | Admit: 2018-01-15 | Discharge: 2018-01-15 | Disposition: A | Discharge status: Home / Self Care | Payer: Medicare Other | Source: Ambulatory Visit | Attending: Oncology | Admitting: Oncology

## 2018-01-15 DIAGNOSIS — C50911 Malignant neoplasm of unspecified site of right female breast: Secondary | ICD-10-CM

## 2018-01-15 DIAGNOSIS — C7951 Secondary malignant neoplasm of bone: Principal | ICD-10-CM

## 2018-01-17 ENCOUNTER — Telehealth: Payer: Self-pay | Admitting: *Deleted

## 2018-01-17 NOTE — Telephone Encounter (Signed)
-----   Message from Ladell Pier, MD sent at 01/16/2018  4:54 PM EST ----- Please call patient Thyroid US shows stable nodules, no need for biopsy, f/u as scheduled

## 2018-01-17 NOTE — Telephone Encounter (Signed)
Notified of thyroid US results-nodules stable and biopsy not indicated per Dr. Benay Spice. Follow up as scheduled.

## 2018-03-29 ENCOUNTER — Inpatient Hospital Stay: Payer: Medicare Other

## 2018-06-17 ENCOUNTER — Telehealth: Payer: Self-pay | Admitting: Oncology

## 2018-06-17 NOTE — Telephone Encounter (Signed)
[facility] 6/19 moved appointments to 6/12. Confirmed with patient.   Per patient she had an e-visit with Dr. Laurann Montana who wanted her to come into the office for labs, however patient didn't feels comfortable with going into office as she  also had an appointment 6/19 @ [facility] with [facility], which would include lab work.   Patient requesting lab work below requested by Dr. Laurann Montana be drawn at [facility] when she comes to see [facility].   Hemoglobin a1c TSH Lipid Panel with Reflex  Message to [facility].

## 2018-06-21 ENCOUNTER — Inpatient Hospital Stay: Payer: Medicare Other

## 2018-06-21 ENCOUNTER — Inpatient Hospital Stay: Payer: Medicare Other | Attending: Oncology

## 2018-06-21 ENCOUNTER — Inpatient Hospital Stay (HOSPITAL_BASED_OUTPATIENT_CLINIC_OR_DEPARTMENT_OTHER): Payer: Medicare Other | Admitting: Oncology

## 2018-06-21 ENCOUNTER — Other Ambulatory Visit: Payer: Self-pay

## 2018-06-21 ENCOUNTER — Other Ambulatory Visit: Payer: Self-pay | Admitting: *Deleted

## 2018-06-21 VITALS — BP 134/71 | HR 73 | Temp 98.3°F | Resp 18 | Ht 62.0 in | Wt 131.7 lb

## 2018-06-21 DIAGNOSIS — N6489 Other specified disorders of breast: Secondary | ICD-10-CM | POA: Diagnosis not present

## 2018-06-21 DIAGNOSIS — Z170 Estrogen receptor positive status [ER+]: Secondary | ICD-10-CM

## 2018-06-21 DIAGNOSIS — Z79811 Long term (current) use of aromatase inhibitors: Secondary | ICD-10-CM | POA: Diagnosis not present

## 2018-06-21 DIAGNOSIS — C50911 Malignant neoplasm of unspecified site of right female breast: Secondary | ICD-10-CM

## 2018-06-21 DIAGNOSIS — N281 Cyst of kidney, acquired: Secondary | ICD-10-CM | POA: Diagnosis not present

## 2018-06-21 DIAGNOSIS — C7951 Secondary malignant neoplasm of bone: Secondary | ICD-10-CM | POA: Diagnosis not present

## 2018-06-21 DIAGNOSIS — E119 Type 2 diabetes mellitus without complications: Secondary | ICD-10-CM

## 2018-06-21 DIAGNOSIS — Z79899 Other long term (current) drug therapy: Secondary | ICD-10-CM | POA: Diagnosis not present

## 2018-06-21 DIAGNOSIS — E785 Hyperlipidemia, unspecified: Secondary | ICD-10-CM

## 2018-06-21 DIAGNOSIS — E042 Nontoxic multinodular goiter: Secondary | ICD-10-CM

## 2018-06-21 LAB — BASIC METABOLIC PANEL - CANCER CENTER ONLY
Anion gap: 13 (ref 5–15)
BUN: 17 mg/dL (ref 8–23)
CO2: 24 mmol/L (ref 22–32)
Calcium: 10.2 mg/dL (ref 8.9–10.3)
Chloride: 100 mmol/L (ref 98–111)
Creatinine: 0.85 mg/dL (ref 0.44–1.00)
GFR, Est AFR Am: >60 mL/min (ref >60)
GFR, Estimated: >60 mL/min (ref >60)
Glucose, Bld: 99 mg/dL (ref 70–99)
Potassium: 3.7 mmol/L (ref 3.5–5.1)
Sodium: 137 mmol/L (ref 135–145)

## 2018-06-21 LAB — LIPID PANEL
Cholesterol: 194 mg/dL (ref 0–200)
HDL: 64 mg/dL (ref >40)
LDL Cholesterol: 113 mg/dL — ABNORMAL HIGH (ref 0–99)
Total CHOL/HDL Ratio: 3 RATIO
Triglycerides: 83 mg/dL (ref <150)
VLDL: 17 mg/dL (ref 0–40)

## 2018-06-21 LAB — TSH: TSH: 0.645 u[IU]/mL (ref 0.308–3.960)

## 2018-06-21 LAB — HEMOGLOBIN A1C
Hgb A1c MFr Bld: 6.7 % — ABNORMAL HIGH (ref 4.8–5.6)
Mean Plasma Glucose: 145.59 mg/dL

## 2018-06-21 MED ORDER — ZOLEDRONIC ACID 4 MG/100ML IV SOLN
4.0000 mg | Freq: Once | INTRAVENOUS | Status: AC
  Administered 2018-06-21: 4 mg via INTRAVENOUS
  Filled 2018-06-21: qty 100

## 2018-06-21 MED ORDER — SODIUM CHLORIDE 0.9 % IV SOLN
Freq: Once | INTRAVENOUS | Status: AC
  Administered 2018-06-21: 14:00:00 via INTRAVENOUS
  Filled 2018-06-21: qty 250

## 2018-06-21 NOTE — Progress Notes (Signed)
[facility] OFFICE PROGRESS NOTE   Diagnosis: Breast cancer  INTERVAL HISTORY:   Cindy Frazier returns as scheduled.  She had pain at the posterior head and bilateral wrists earlier this year.  A brain CT was unremarkable.  She reports the symptoms resolved over a few months.  She has no pain at present.  She continues Arimidex.  She lost weight when she had pain earlier in the year.  She also had a low-grade fever and sweats at the time.  The symptoms have resolved.  Her weight is now stable.  Objective:  Vital signs in last 24 hours:  Blood pressure 134/71, pulse 73, temperature 98.3 F (36.8 C), temperature source Oral, resp. rate 18, height 5' 2"  (1.575 m), weight 131 lb 11.2 oz (59.7 kg), SpO2 100 %.    Physical examination not performed secondary to distancing with the COVID pandemic  Lab Results:  Lab Results  Component Value Date   WBC 7.2 02/21/2013   HGB 13.8 02/21/2013   HCT 43.9 02/21/2013   MCV 83.5 02/21/2013   PLT 291 02/21/2013   NEUTROABS 4.2 02/21/2013    CMP  Lab Results  Component Value Date   NA 137 06/21/2018   K 3.7 06/21/2018   CL 100 06/21/2018   CO2 24 06/21/2018   GLUCOSE 99 06/21/2018   BUN 17 06/21/2018   CREATININE 0.85 06/21/2018   CALCIUM 10.2 06/21/2018   PROT 7.6 12/31/2015   ALBUMIN 4.0 12/31/2015   AST 14 12/31/2015   ALT 22 12/31/2015   ALKPHOS 57 12/31/2015   BILITOT 0.37 12/31/2015   GFRNONAA >60 06/21/2018   GFRAA >60 06/21/2018    No results found for: CEA1  No results found for: INR  Imaging:  No results found.  Medications: I have reviewed the patient's current medications.   Assessment/Plan:  1. Breast cancer-stage IIA (T2 N0) right breast cancer diagnosed in January 2000, status post a right mastectomy and TRAM reconstruction. She completed adjuvant chemotherapy followed by 5 years of tamoxifen and then 5 years of Femara. She completed Femara at the end of July 2010.  2. Right groin pain  with an MRI and bone scan February 2015 concerning for metastatic disease involving the right acetabulum/ileum.  Cycle 1 Zometa 03/21/2013   Status post SBRT to the right acetabulum completed 03/26/2013   Initiation of Arimidex 03/27/2013.  Bone scan 09/23/2013. Metastatic disease in the right hemipelvis. Overall degree of activity decreased when compared to the prior exam.  Bone scan 04/21/2014. Right pelvic/acetabular uptake slightly decreased since the prior exam.  Bone scan 10/02/2014-unchanged metastasis at the right acetabulum, no new metastases, activity at the right greater than left mandible felt to reflect dental disease  Bone scan 06/18/2015-no change in the right acetabulum metastasis. No new metastases.  Bonescan 04/22/2016-no change in right acetabulum metastasis, no new metastases  CTs chest, abdomen, and pelvis on 12/28/2017- lytic/sclerotic lesion of the right acetabulum, no other bone lesions, no other evidence of metastatic disease in the chest, abdomen, and pelvis, multiple thyroid nodules 3. Status post CT guided right superior acetabular bone biopsy on 02/28/2013. Pathology showed metastatic carcinoma consistent with breast primary; ER positive (90-100%); PR weakly positive (5-10%); HER-2/neu not amplified; Ki-67 20%. 4. CT chest/abdomen/pelvis 02/28/2013 with mild thyroid enlargement and multiple low-attenuation lesions probably due to multinodular goiter; suggestion of asymmetry in the deep lateral central left breast; cholelithiasis; small left renal cysts; degenerative changes throughout the spine; no lymphadenopathy in the abdomen or pelvis; no  concerning solid organ lesions; mixed lytic and sclerotic destructive lesion of the right acetabulum. 5. History of Elevated calcium level-likely related to taking a potassium supplement prior to the blood draws 6. Thyroid ultrasound- compared to June 2015-stable thyromegaly, bilateral nodules do not meet criteria for  biopsy     Disposition: Ms. Carreras appears unchanged.  There is no clinical evidence of disease progression.  She will continue Arimidex.  She will receive Zometa today.  She will return for an office visit and restaging bone scan in 6 months.  15 minutes were spent with the patient today.  The majority of the time was used for counseling and coordination of care.  Betsy Coder, MD  06/21/2018  12:54 PM

## 2018-06-21 NOTE — Patient Instructions (Signed)

## 2018-06-24 ENCOUNTER — Telehealth: Payer: Self-pay | Admitting: Oncology

## 2018-06-24 NOTE — Telephone Encounter (Signed)
Scheduled per los. Mailed printout  °

## 2018-06-28 ENCOUNTER — Ambulatory Visit: Payer: Medicare Other

## 2018-06-28 ENCOUNTER — Ambulatory Visit: Payer: Medicare Other | Admitting: Oncology

## 2018-06-28 ENCOUNTER — Other Ambulatory Visit: Payer: Medicare Other

## 2018-07-08 ENCOUNTER — Telehealth: Payer: Self-pay | Admitting: *Deleted

## 2018-07-08 ENCOUNTER — Other Ambulatory Visit: Payer: Self-pay | Admitting: Oncology

## 2018-07-08 DIAGNOSIS — C50919 Malignant neoplasm of unspecified site of unspecified female breast: Secondary | ICD-10-CM

## 2018-07-08 DIAGNOSIS — C50911 Malignant neoplasm of unspecified site of right female breast: Secondary | ICD-10-CM

## 2018-07-08 NOTE — Telephone Encounter (Signed)
Called to request her schedule be corrected to add lab/zometa in September. Confirmed it is due every 3 months. Scheduling message sent.

## 2018-07-09 ENCOUNTER — Telehealth: Payer: Self-pay | Admitting: Oncology

## 2018-07-09 NOTE — Telephone Encounter (Signed)
Scheduled appt per 6/29 sch message - pt aware of appt date and time   

## 2018-09-23 ENCOUNTER — Inpatient Hospital Stay: Payer: Medicare Other

## 2018-09-23 ENCOUNTER — Other Ambulatory Visit: Payer: Self-pay

## 2018-09-23 ENCOUNTER — Inpatient Hospital Stay: Payer: Medicare Other | Attending: Oncology

## 2018-09-23 VITALS — BP 124/59 | HR 67 | Temp 98.2°F | Resp 17

## 2018-09-23 DIAGNOSIS — C7951 Secondary malignant neoplasm of bone: Secondary | ICD-10-CM | POA: Diagnosis not present

## 2018-09-23 DIAGNOSIS — Z170 Estrogen receptor positive status [ER+]: Secondary | ICD-10-CM | POA: Diagnosis not present

## 2018-09-23 DIAGNOSIS — C50911 Malignant neoplasm of unspecified site of right female breast: Secondary | ICD-10-CM | POA: Diagnosis present

## 2018-09-23 DIAGNOSIS — Z79811 Long term (current) use of aromatase inhibitors: Secondary | ICD-10-CM | POA: Insufficient documentation

## 2018-09-23 LAB — BASIC METABOLIC PANEL - CANCER CENTER ONLY
Anion gap: 11 (ref 5–15)
BUN: 16 mg/dL (ref 8–23)
CO2: 25 mmol/L (ref 22–32)
Calcium: 9.7 mg/dL (ref 8.9–10.3)
Chloride: 102 mmol/L (ref 98–111)
Creatinine: 1 mg/dL (ref 0.44–1.00)
GFR, Est AFR Am: >60 mL/min (ref >60)
GFR, Estimated: 58 mL/min — ABNORMAL LOW (ref >60)
Glucose, Bld: 184 mg/dL — ABNORMAL HIGH (ref 70–99)
Potassium: 4.4 mmol/L (ref 3.5–5.1)
Sodium: 138 mmol/L (ref 135–145)

## 2018-09-23 MED ORDER — SODIUM CHLORIDE 0.9 % IV SOLN
INTRAVENOUS | Status: DC
  Administered 2018-09-23: 10:00:00 via INTRAVENOUS
  Filled 2018-09-23: qty 250

## 2018-09-23 MED ORDER — ZOLEDRONIC ACID 4 MG/100ML IV SOLN
4.0000 mg | Freq: Once | INTRAVENOUS | Status: AC
  Administered 2018-09-23: 4 mg via INTRAVENOUS
  Filled 2018-09-23: qty 100

## 2018-09-23 NOTE — Patient Instructions (Signed)
Zoledronic Acid injection (Hypercalcemia, Oncology) What is this medicine? ZOLEDRONIC ACID (ZOE le dron ik AS id) lowers the amount of calcium loss from bone. It is used to treat too much calcium in your blood from cancer. It is also used to prevent complications of cancer that has spread to the bone. This medicine may be used for other purposes; ask your health care provider or pharmacist if you have questions. COMMON BRAND NAME(S): Zometa What should I tell my health care provider before I take this medicine? They need to know if you have any of these conditions:  aspirin-sensitive asthma  cancer, especially if you are receiving medicines used to treat cancer  dental disease or wear dentures  infection  kidney disease  receiving corticosteroids like dexamethasone or prednisone  an unusual or allergic reaction to zoledronic acid, other medicines, foods, dyes, or preservatives  pregnant or trying to get pregnant  breast-feeding How should I use this medicine? This medicine is for infusion into a vein. It is given by a health care professional in a hospital or clinic setting. Talk to your pediatrician regarding the use of this medicine in children. Special care may be needed. Overdosage: If you think you have taken too much of this medicine contact a poison control center or emergency room at once. NOTE: This medicine is only for you. Do not share this medicine with others. What if I miss a dose? It is important not to miss your dose. Call your doctor or health care professional if you are unable to keep an appointment. What may interact with this medicine?  certain antibiotics given by injection  NSAIDs, medicines for pain and inflammation, like ibuprofen or naproxen  some diuretics like bumetanide, furosemide  teriparatide  thalidomide This list may not describe all possible interactions. Give your health care provider a list of all the medicines, herbs, non-prescription  drugs, or dietary supplements you use. Also tell them if you smoke, drink alcohol, or use illegal drugs. Some items may interact with your medicine. What should I watch for while using this medicine? Visit your doctor or health care professional for regular checkups. It may be some time before you see the benefit from this medicine. Do not stop taking your medicine unless your doctor tells you to. Your doctor may order blood tests or other tests to see how you are doing. Women should inform their doctor if they wish to become pregnant or think they might be pregnant. There is a potential for serious side effects to an unborn child. Talk to your health care professional or pharmacist for more information. You should make sure that you get enough calcium and vitamin D while you are taking this medicine. Discuss the foods you eat and the vitamins you take with your health care professional. Some people who take this medicine have severe bone, joint, and/or muscle pain. This medicine may also increase your risk for jaw problems or a broken thigh bone. Tell your doctor right away if you have severe pain in your jaw, bones, joints, or muscles. Tell your doctor if you have any pain that does not go away or that gets worse. Tell your dentist and dental surgeon that you are taking this medicine. You should not have major dental surgery while on this medicine. See your dentist to have a dental exam and fix any dental problems before starting this medicine. Take good care of your teeth while on this medicine. Make sure you see your dentist for regular follow-up   appointments. What side effects may I notice from receiving this medicine? Side effects that you should report to your doctor or health care professional as soon as possible:  allergic reactions like skin rash, itching or hives, swelling of the face, lips, or tongue  anxiety, confusion, or depression  breathing problems  changes in vision  eye  pain  feeling faint or lightheaded, falls  jaw pain, especially after dental work  mouth sores  muscle cramps, stiffness, or weakness  redness, blistering, peeling or loosening of the skin, including inside the mouth  trouble passing urine or change in the amount of urine Side effects that usually do not require medical attention (report to your doctor or health care professional if they continue or are bothersome):  bone, joint, or muscle pain  constipation  diarrhea  fever  hair loss  irritation at site where injected  loss of appetite  nausea, vomiting  stomach upset  trouble sleeping  trouble swallowing  weak or tired This list may not describe all possible side effects. Call your doctor for medical advice about side effects. You may report side effects to FDA at 1-800-FDA-1088. Where should I keep my medicine? This drug is given in a hospital or clinic and will not be stored at home. NOTE: This sheet is a summary. It may not cover all possible information. If you have questions about this medicine, talk to your doctor, pharmacist, or health care provider.  2020 Elsevier/Gold Standard (2013-05-24 14:19:39)  

## 2018-11-15 ENCOUNTER — Other Ambulatory Visit: Payer: Self-pay | Admitting: Oncology

## 2018-11-15 DIAGNOSIS — Z1231 Encounter for screening mammogram for malignant neoplasm of breast: Secondary | ICD-10-CM

## 2018-12-16 ENCOUNTER — Other Ambulatory Visit (HOSPITAL_COMMUNITY): Payer: Medicare Other

## 2018-12-16 ENCOUNTER — Ambulatory Visit (HOSPITAL_COMMUNITY): Payer: Medicare Other

## 2018-12-17 ENCOUNTER — Other Ambulatory Visit: Payer: Self-pay

## 2018-12-17 ENCOUNTER — Inpatient Hospital Stay: Payer: Medicare Other | Attending: Oncology | Admitting: Oncology

## 2018-12-17 ENCOUNTER — Ambulatory Visit (HOSPITAL_COMMUNITY)
Admission: RE | Admit: 2018-12-17 | Discharge: 2018-12-17 | Disposition: A | Discharge status: Home / Self Care | Payer: Medicare Other | Source: Ambulatory Visit | Attending: Oncology | Admitting: Oncology

## 2018-12-17 ENCOUNTER — Encounter (HOSPITAL_COMMUNITY)
Admission: RE | Admit: 2018-12-17 | Discharge: 2018-12-17 | Disposition: A | Discharge status: Home / Self Care | Payer: Medicare Other | Source: Ambulatory Visit | Attending: Oncology | Admitting: Oncology

## 2018-12-17 ENCOUNTER — Inpatient Hospital Stay: Payer: Medicare Other

## 2018-12-17 VITALS — BP 155/73 | HR 71 | Temp 98.4°F | Resp 17 | Ht 62.0 in | Wt 137.0 lb

## 2018-12-17 DIAGNOSIS — Z170 Estrogen receptor positive status [ER+]: Secondary | ICD-10-CM | POA: Diagnosis not present

## 2018-12-17 DIAGNOSIS — C50911 Malignant neoplasm of unspecified site of right female breast: Secondary | ICD-10-CM

## 2018-12-17 DIAGNOSIS — C7951 Secondary malignant neoplasm of bone: Secondary | ICD-10-CM | POA: Diagnosis not present

## 2018-12-17 DIAGNOSIS — Z79811 Long term (current) use of aromatase inhibitors: Secondary | ICD-10-CM | POA: Insufficient documentation

## 2018-12-17 LAB — BASIC METABOLIC PANEL - CANCER CENTER ONLY
Anion gap: 9 (ref 5–15)
BUN: 23 mg/dL (ref 8–23)
CO2: 29 mmol/L (ref 22–32)
Calcium: 9.8 mg/dL (ref 8.9–10.3)
Chloride: 102 mmol/L (ref 98–111)
Creatinine: 0.87 mg/dL (ref 0.44–1.00)
GFR, Est AFR Am: >60 mL/min (ref >60)
GFR, Estimated: >60 mL/min (ref >60)
Glucose, Bld: 127 mg/dL — ABNORMAL HIGH (ref 70–99)
Potassium: 4.2 mmol/L (ref 3.5–5.1)
Sodium: 140 mmol/L (ref 135–145)

## 2018-12-17 MED ORDER — TECHNETIUM TC 99M MEDRONATE IV KIT
20.5000 | PACK | Freq: Once | INTRAVENOUS | Status: AC
  Administered 2018-12-17: 20.5 via INTRAVENOUS

## 2018-12-17 MED ORDER — ZOLEDRONIC ACID 4 MG/100ML IV SOLN
4.0000 mg | Freq: Once | INTRAVENOUS | Status: AC
  Administered 2018-12-17: 4 mg via INTRAVENOUS

## 2018-12-17 MED ORDER — SODIUM CHLORIDE 0.9 % IV SOLN
INTRAVENOUS | Status: DC
  Administered 2018-12-17: 11:00:00 via INTRAVENOUS
  Filled 2018-12-17: qty 250

## 2018-12-17 NOTE — Progress Notes (Signed)
  [facility] OFFICE PROGRESS NOTE   Diagnosis: Breast cancer  INTERVAL HISTORY:   Cindy Frazier returns as scheduled.  She continues Arimidex.  No significant pain.  No hot flashes or arthralgias.  No change over either chest wall.  Good appetite.  No jaw or tooth pain.  Objective:  Vital signs in last 24 hours:  Blood pressure (!) 155/73, pulse 71, temperature 98.4 F (36.9 C), temperature source Temporal, resp. rate 17, height _0  (1.575 m), weight 137 lb (62.1 kg), SpO2 100 %.   Physical examination not performed today secondary to distancing with the Covid pandemic   Medications: I have reviewed the patient's current medications.   Assessment/Plan: 1. Breast cancer-stage IIA (T2 N0) right breast cancer diagnosed in January 2000, status post a right mastectomy and TRAM reconstruction. She completed adjuvant chemotherapy followed by 5 years of tamoxifen and then 5 years of Femara. She completed Femara at the end of July 2010.  2. Right groin pain with an MRI and bone scan February 2015 concerning for metastatic disease involving the right acetabulum/ileum.  Cycle 1 Zometa 03/21/2013   Status post SBRT to the right acetabulum completed 03/26/2013   Initiation of Arimidex 03/27/2013.  Bone scan 09/23/2013. Metastatic disease in the right hemipelvis. Overall degree of activity decreased when compared to the prior exam.  Bone scan 04/21/2014. Right pelvic/acetabular uptake slightly decreased since the prior exam.  Bone scan 10/02/2014-unchanged metastasis at the right acetabulum, no new metastases, activity at the right greater than left mandible felt to reflect dental disease  Bone scan 06/18/2015-no change in the right acetabulum metastasis. No new metastases.  Bonescan 04/22/2016-no change in right acetabulum metastasis, no new metastases  CTs chest, abdomen, and pelvis on 12/28/2017- lytic/sclerotic lesion of the right acetabulum, no other bone  lesions, no other evidence of metastatic disease in the chest, abdomen, and pelvis, multiple thyroid nodules 3. Status post CT guided right superior acetabular bone biopsy on 02/28/2013. Pathology showed metastatic carcinoma consistent with breast primary; ER positive (90-100%); PR weakly positive (5-10%); HER-2/neu not amplified; Ki-67 20%. 4. CT chest/abdomen/pelvis 02/28/2013 with mild thyroid enlargement and multiple low-attenuation lesions probably due to multinodular goiter; suggestion of asymmetry in the deep lateral central left breast; cholelithiasis; small left renal cysts; degenerative changes throughout the spine; no lymphadenopathy in the abdomen or pelvis; no concerning solid organ lesions; mixed lytic and sclerotic destructive lesion of the right acetabulum. 5. History of Elevated calcium level-likely related to taking a potassium supplement prior to the blood draws 6. Thyroid ultrasound 01/15/2018 - compared to June 2015-stable thyromegaly, bilateral nodules do not meet criteria for biopsy     Disposition: Cindy Frazier appears stable.  There is no clinical evidence for progression of breast cancer.  She will continue Arimidex.  She will have a restaging bone scan later today.  She continues every 70-monthZometa.  Ms. NSevertsonwill return for an office visit in 6 months.  She will contact [place] the interim for new symptoms.  GBetsy Coder MD  12/17/2018  10:20 AM

## 2018-12-17 NOTE — Patient Instructions (Signed)
Zoledronic Acid injection (Hypercalcemia, Oncology) What is this medicine? ZOLEDRONIC ACID (ZOE le dron ik AS id) lowers the amount of calcium loss from bone. It is used to treat too much calcium in your blood from cancer. It is also used to prevent complications of cancer that has spread to the bone. This medicine may be used for other purposes; ask your health care provider or pharmacist if you have questions. COMMON BRAND NAME(S): Zometa What should I tell my health care provider before I take this medicine? They need to know if you have any of these conditions:  aspirin-sensitive asthma  cancer, especially if you are receiving medicines used to treat cancer  dental disease or wear dentures  infection  kidney disease  receiving corticosteroids like dexamethasone or prednisone  an unusual or allergic reaction to zoledronic acid, other medicines, foods, dyes, or preservatives  pregnant or trying to get pregnant  breast-feeding How should I use this medicine? This medicine is for infusion into a vein. It is given by a health care professional in a hospital or clinic setting. Talk to your pediatrician regarding the use of this medicine in children. Special care may be needed. Overdosage: If you think you have taken too much of this medicine contact a poison control center or emergency room at once. NOTE: This medicine is only for you. Do not share this medicine with others. What if I miss a dose? It is important not to miss your dose. Call your doctor or health care professional if you are unable to keep an appointment. What may interact with this medicine?  certain antibiotics given by injection  NSAIDs, medicines for pain and inflammation, like ibuprofen or naproxen  some diuretics like bumetanide, furosemide  teriparatide  thalidomide This list may not describe all possible interactions. Give your health care provider a list of all the medicines, herbs, non-prescription  drugs, or dietary supplements you use. Also tell them if you smoke, drink alcohol, or use illegal drugs. Some items may interact with your medicine. What should I watch for while using this medicine? Visit your doctor or health care professional for regular checkups. It may be some time before you see the benefit from this medicine. Do not stop taking your medicine unless your doctor tells you to. Your doctor may order blood tests or other tests to see how you are doing. Women should inform their doctor if they wish to become pregnant or think they might be pregnant. There is a potential for serious side effects to an unborn child. Talk to your health care professional or pharmacist for more information. You should make sure that you get enough calcium and vitamin D while you are taking this medicine. Discuss the foods you eat and the vitamins you take with your health care professional. Some people who take this medicine have severe bone, joint, and/or muscle pain. This medicine may also increase your risk for jaw problems or a broken thigh bone. Tell your doctor right away if you have severe pain in your jaw, bones, joints, or muscles. Tell your doctor if you have any pain that does not go away or that gets worse. Tell your dentist and dental surgeon that you are taking this medicine. You should not have major dental surgery while on this medicine. See your dentist to have a dental exam and fix any dental problems before starting this medicine. Take good care of your teeth while on this medicine. Make sure you see your dentist for regular follow-up   appointments. What side effects may I notice from receiving this medicine? Side effects that you should report to your doctor or health care professional as soon as possible:  allergic reactions like skin rash, itching or hives, swelling of the face, lips, or tongue  anxiety, confusion, or depression  breathing problems  changes in vision  eye  pain  feeling faint or lightheaded, falls  jaw pain, especially after dental work  mouth sores  muscle cramps, stiffness, or weakness  redness, blistering, peeling or loosening of the skin, including inside the mouth  trouble passing urine or change in the amount of urine Side effects that usually do not require medical attention (report to your doctor or health care professional if they continue or are bothersome):  bone, joint, or muscle pain  constipation  diarrhea  fever  hair loss  irritation at site where injected  loss of appetite  nausea, vomiting  stomach upset  trouble sleeping  trouble swallowing  weak or tired This list may not describe all possible side effects. Call your doctor for medical advice about side effects. You may report side effects to FDA at 1-800-FDA-1088. Where should I keep my medicine? This drug is given in a hospital or clinic and will not be stored at home. NOTE: This sheet is a summary. It may not cover all possible information. If you have questions about this medicine, talk to your doctor, pharmacist, or health care provider.  2020 Elsevier/Gold Standard (2013-05-24 14:19:39)  

## 2018-12-18 ENCOUNTER — Telehealth: Payer: Self-pay | Admitting: *Deleted

## 2018-12-18 ENCOUNTER — Telehealth: Payer: Self-pay | Admitting: Oncology

## 2018-12-18 NOTE — Telephone Encounter (Signed)
Scheduled per los. Called and left msg. Mailed printout  °

## 2018-12-18 NOTE — Telephone Encounter (Signed)
Notified patient that bone scan is stable compared to last scan, however radiologist mentioned new activity in neck but it is symmetrical which is not typical for cancer. Dr. Benay Spice thinks it was there prior upon his review. He is trying to speak with radiologist to take another look at recent and prior to confirm. She understands and agrees not to worry.

## 2018-12-20 NOTE — Progress Notes (Signed)
Reviewed the bone scan images with Dr. Melanee Spry.  The new changes at the posterior aspect of the cervical spine.  Dr. Melanee Spry feels this is very likely degenerative change.  I discussed the bone scan findings with Ms. Cindy Frazier.  She has occasional discomfort in the neck, but no consistent pain.  She will call for increased pain or new symptoms.  We will order a CT of the neck for new symptoms.

## 2019-01-13 ENCOUNTER — Ambulatory Visit
Admission: RE | Admit: 2019-01-13 | Discharge: 2019-01-13 | Disposition: A | Discharge status: Home / Self Care | Payer: Medicare PPO | Source: Ambulatory Visit | Attending: Oncology | Admitting: Oncology

## 2019-01-13 ENCOUNTER — Other Ambulatory Visit: Payer: Self-pay

## 2019-01-13 DIAGNOSIS — Z1231 Encounter for screening mammogram for malignant neoplasm of breast: Secondary | ICD-10-CM

## 2019-01-21 ENCOUNTER — Other Ambulatory Visit: Payer: Self-pay | Admitting: Oncology

## 2019-01-21 DIAGNOSIS — C50919 Malignant neoplasm of unspecified site of unspecified female breast: Secondary | ICD-10-CM

## 2019-01-21 DIAGNOSIS — C7951 Secondary malignant neoplasm of bone: Secondary | ICD-10-CM

## 2019-03-17 ENCOUNTER — Inpatient Hospital Stay: Payer: Medicare PPO

## 2019-03-17 ENCOUNTER — Inpatient Hospital Stay: Payer: Medicare PPO | Attending: Oncology

## 2019-03-17 ENCOUNTER — Other Ambulatory Visit: Payer: Self-pay

## 2019-03-17 VITALS — BP 116/60 | HR 65 | Temp 98.5°F | Resp 18

## 2019-03-17 DIAGNOSIS — Z170 Estrogen receptor positive status [ER+]: Secondary | ICD-10-CM | POA: Insufficient documentation

## 2019-03-17 DIAGNOSIS — C7951 Secondary malignant neoplasm of bone: Secondary | ICD-10-CM | POA: Diagnosis present

## 2019-03-17 DIAGNOSIS — C50911 Malignant neoplasm of unspecified site of right female breast: Secondary | ICD-10-CM

## 2019-03-17 LAB — BASIC METABOLIC PANEL - CANCER CENTER ONLY
Anion gap: 13 (ref 5–15)
BUN: 23 mg/dL (ref 8–23)
CO2: 27 mmol/L (ref 22–32)
Calcium: 10 mg/dL (ref 8.9–10.3)
Chloride: 100 mmol/L (ref 98–111)
Creatinine: 0.95 mg/dL (ref 0.44–1.00)
GFR, Est AFR Am: >60 mL/min (ref >60)
GFR, Estimated: >60 mL/min (ref >60)
Glucose, Bld: 211 mg/dL — ABNORMAL HIGH (ref 70–99)
Potassium: 3.9 mmol/L (ref 3.5–5.1)
Sodium: 140 mmol/L (ref 135–145)

## 2019-03-17 MED ORDER — SODIUM CHLORIDE 0.9 % IV SOLN
Freq: Once | INTRAVENOUS | Status: AC
  Filled 2019-03-17: qty 250

## 2019-03-17 MED ORDER — ZOLEDRONIC ACID 4 MG/100ML IV SOLN
4.0000 mg | Freq: Once | INTRAVENOUS | Status: AC
  Administered 2019-03-17: 4 mg via INTRAVENOUS

## 2019-03-17 MED ORDER — ZOLEDRONIC ACID 4 MG/100ML IV SOLN
INTRAVENOUS | Status: AC
  Filled 2019-03-17: qty 100

## 2019-03-17 NOTE — Patient Instructions (Signed)
Zoledronic Acid injection (Hypercalcemia, Oncology) What is this medicine? ZOLEDRONIC ACID (ZOE le dron ik AS id) lowers the amount of calcium loss from bone. It is used to treat too much calcium in your blood from cancer. It is also used to prevent complications of cancer that has spread to the bone. This medicine may be used for other purposes; ask your health care provider or pharmacist if you have questions. COMMON BRAND NAME(S): Zometa What should I tell my health care provider before I take this medicine? They need to know if you have any of these conditions:  aspirin-sensitive asthma  cancer, especially if you are receiving medicines used to treat cancer  dental disease or wear dentures  infection  kidney disease  receiving corticosteroids like dexamethasone or prednisone  an unusual or allergic reaction to zoledronic acid, other medicines, foods, dyes, or preservatives  pregnant or trying to get pregnant  breast-feeding How should I use this medicine? This medicine is for infusion into a vein. It is given by a health care professional in a hospital or clinic setting. Talk to your pediatrician regarding the use of this medicine in children. Special care may be needed. Overdosage: If you think you have taken too much of this medicine contact a poison control center or emergency room at once. NOTE: This medicine is only for you. Do not share this medicine with others. What if I miss a dose? It is important not to miss your dose. Call your doctor or health care professional if you are unable to keep an appointment. What may interact with this medicine?  certain antibiotics given by injection  NSAIDs, medicines for pain and inflammation, like ibuprofen or naproxen  some diuretics like bumetanide, furosemide  teriparatide  thalidomide This list may not describe all possible interactions. Give your health care provider a list of all the medicines, herbs, non-prescription  drugs, or dietary supplements you use. Also tell them if you smoke, drink alcohol, or use illegal drugs. Some items may interact with your medicine. What should I watch for while using this medicine? Visit your doctor or health care professional for regular checkups. It may be some time before you see the benefit from this medicine. Do not stop taking your medicine unless your doctor tells you to. Your doctor may order blood tests or other tests to see how you are doing. Women should inform their doctor if they wish to become pregnant or think they might be pregnant. There is a potential for serious side effects to an unborn child. Talk to your health care professional or pharmacist for more information. You should make sure that you get enough calcium and vitamin D while you are taking this medicine. Discuss the foods you eat and the vitamins you take with your health care professional. Some people who take this medicine have severe bone, joint, and/or muscle pain. This medicine may also increase your risk for jaw problems or a broken thigh bone. Tell your doctor right away if you have severe pain in your jaw, bones, joints, or muscles. Tell your doctor if you have any pain that does not go away or that gets worse. Tell your dentist and dental surgeon that you are taking this medicine. You should not have major dental surgery while on this medicine. See your dentist to have a dental exam and fix any dental problems before starting this medicine. Take good care of your teeth while on this medicine. Make sure you see your dentist for regular follow-up   appointments. What side effects may I notice from receiving this medicine? Side effects that you should report to your doctor or health care professional as soon as possible:  allergic reactions like skin rash, itching or hives, swelling of the face, lips, or tongue  anxiety, confusion, or depression  breathing problems  changes in vision  eye  pain  feeling faint or lightheaded, falls  jaw pain, especially after dental work  mouth sores  muscle cramps, stiffness, or weakness  redness, blistering, peeling or loosening of the skin, including inside the mouth  trouble passing urine or change in the amount of urine Side effects that usually do not require medical attention (report to your doctor or health care professional if they continue or are bothersome):  bone, joint, or muscle pain  constipation  diarrhea  fever  hair loss  irritation at site where injected  loss of appetite  nausea, vomiting  stomach upset  trouble sleeping  trouble swallowing  weak or tired This list may not describe all possible side effects. Call your doctor for medical advice about side effects. You may report side effects to FDA at 1-800-FDA-1088. Where should I keep my medicine? This drug is given in a hospital or clinic and will not be stored at home. NOTE: This sheet is a summary. It may not cover all possible information. If you have questions about this medicine, talk to your doctor, pharmacist, or health care provider.  2020 Elsevier/Gold Standard (2013-05-24 14:19:39)  

## 2019-05-15 DIAGNOSIS — E1169 Type 2 diabetes mellitus with other specified complication: Secondary | ICD-10-CM | POA: Diagnosis not present

## 2019-05-15 DIAGNOSIS — I10 Essential (primary) hypertension: Secondary | ICD-10-CM | POA: Diagnosis not present

## 2019-05-15 DIAGNOSIS — E785 Hyperlipidemia, unspecified: Secondary | ICD-10-CM | POA: Diagnosis not present

## 2019-05-15 DIAGNOSIS — C50919 Malignant neoplasm of unspecified site of unspecified female breast: Secondary | ICD-10-CM | POA: Diagnosis not present

## 2019-06-17 ENCOUNTER — Inpatient Hospital Stay: Payer: Medicare PPO | Attending: Oncology | Admitting: Oncology

## 2019-06-17 ENCOUNTER — Inpatient Hospital Stay: Payer: Medicare PPO

## 2019-06-17 ENCOUNTER — Other Ambulatory Visit: Payer: Self-pay

## 2019-06-17 VITALS — BP 131/69 | HR 80 | Temp 98.6°F | Resp 17 | Ht 62.0 in | Wt 141.3 lb

## 2019-06-17 DIAGNOSIS — Z923 Personal history of irradiation: Secondary | ICD-10-CM | POA: Diagnosis not present

## 2019-06-17 DIAGNOSIS — Z79811 Long term (current) use of aromatase inhibitors: Secondary | ICD-10-CM | POA: Diagnosis not present

## 2019-06-17 DIAGNOSIS — Z9011 Acquired absence of right breast and nipple: Secondary | ICD-10-CM | POA: Diagnosis not present

## 2019-06-17 DIAGNOSIS — C7951 Secondary malignant neoplasm of bone: Secondary | ICD-10-CM | POA: Diagnosis not present

## 2019-06-17 DIAGNOSIS — Z9221 Personal history of antineoplastic chemotherapy: Secondary | ICD-10-CM | POA: Insufficient documentation

## 2019-06-17 DIAGNOSIS — Z170 Estrogen receptor positive status [ER+]: Secondary | ICD-10-CM | POA: Diagnosis not present

## 2019-06-17 DIAGNOSIS — C50911 Malignant neoplasm of unspecified site of right female breast: Secondary | ICD-10-CM | POA: Insufficient documentation

## 2019-06-17 LAB — BASIC METABOLIC PANEL - CANCER CENTER ONLY
Anion gap: 13 (ref 5–15)
BUN: 22 mg/dL (ref 8–23)
CO2: 25 mmol/L (ref 22–32)
Calcium: 10.1 mg/dL (ref 8.9–10.3)
Chloride: 100 mmol/L (ref 98–111)
Creatinine: 0.94 mg/dL (ref 0.44–1.00)
GFR, Est AFR Am: >60 mL/min (ref >60)
GFR, Estimated: >60 mL/min (ref >60)
Glucose, Bld: 188 mg/dL — ABNORMAL HIGH (ref 70–99)
Potassium: 3.9 mmol/L (ref 3.5–5.1)
Sodium: 138 mmol/L (ref 135–145)

## 2019-06-17 NOTE — Progress Notes (Signed)
Unable to obtain IV access, patient requested to reschedule to early next week. MD Sherrill/Susan RN stated it was ok to reschedule for 6/14 and use the labs from 6/8. Patient aware and agreed.

## 2019-06-17 NOTE — Progress Notes (Signed)
[facility] OFFICE PROGRESS NOTE   Diagnosis: Breast cancer  INTERVAL HISTORY:   Cindy Frazier returns as scheduled.  She continues on Arimidex.  She feels well.  She has intermittent discomfort in the bilateral shoulder.  The pain is worse with certain movements.  No hip pain.  No other complaint.  Objective:  Vital signs in last 24 hours:  Blood pressure 131/69, pulse 80, temperature 98.6 F (37 C), temperature source Oral, resp. rate 17, height 5' 2"  (1.575 m), weight 141 lb 4.8 oz (64.1 kg), SpO2 99 %.   Lymphatics: No cervical, supraclavicular, or axillary nodes Resp: Lungs clear bilaterally Cardio: Regular rate and rhythm GI: No hepatomegaly, nontender Vascular: No leg edema Breast: Status post right mastectomy with a TRAM reconstruction.  No evidence for chest wall tumor recurrence.  Left breast without mass Musculoskeletal: Mild discomfort with abduction and posterior extension at the bilateral shoulder.  No tenderness at the shoulder joints.    Lab Results:  Lab Results  Component Value Date   WBC 7.2 02/21/2013   HGB 13.8 02/21/2013   HCT 43.9 02/21/2013   MCV 83.5 02/21/2013   PLT 291 02/21/2013   NEUTROABS 4.2 02/21/2013    CMP  Lab Results  Component Value Date   NA 138 06/17/2019   K 3.9 06/17/2019   CL 100 06/17/2019   CO2 25 06/17/2019   GLUCOSE 188 (H) 06/17/2019   BUN 22 06/17/2019   CREATININE 0.94 06/17/2019   CALCIUM 10.1 06/17/2019   PROT 7.6 12/31/2015   ALBUMIN 4.0 12/31/2015   AST 14 12/31/2015   ALT 22 12/31/2015   ALKPHOS 57 12/31/2015   BILITOT 0.37 12/31/2015   GFRNONAA >60 06/17/2019   GFRAA >60 06/17/2019     Medications: I have reviewed the patient's current medications.   Assessment/Plan: 1. Breast cancer-stage IIA (T2 N0) right breast cancer diagnosed in January 2000, status post a right mastectomy and TRAM reconstruction. She completed adjuvant chemotherapy followed by 5 years of tamoxifen and then 5  years of Femara. She completed Femara at the end of July 2010.  2. Right groin pain with an MRI and bone scan February 2015 concerning for metastatic disease involving the right acetabulum/ileum.  Cycle 1 Zometa 03/21/2013   Status post SBRT to the right acetabulum completed 03/26/2013   Initiation of Arimidex 03/27/2013.  Bone scan 09/23/2013. Metastatic disease in the right hemipelvis. Overall degree of activity decreased when compared to the prior exam.  Bone scan 04/21/2014. Right pelvic/acetabular uptake slightly decreased since the prior exam.  Bone scan 10/02/2014-unchanged metastasis at the right acetabulum, no new metastases, activity at the right greater than left mandible felt to reflect dental disease  Bone scan 06/18/2015-no change in the right acetabulum metastasis. No new metastases.  Bonescan 04/22/2016-no change in right acetabulum metastasis, no new metastases  CTs chest, abdomen, and pelvis on 12/28/2017- lytic/sclerotic lesion of the right acetabulum, no other bone lesions, no other evidence of metastatic disease in the chest, abdomen, and pelvis, multiple thyroid nodules  Bone scan 12/17/2018-new focal area of increased activity at the mid right cervical spine-felt to most likely be degenerative after review with radiology, unchanged activity at the right acetabulum 3. Status post CT guided right superior acetabular bone biopsy on 02/28/2013. Pathology showed metastatic carcinoma consistent with breast primary; ER positive (90-100%); PR weakly positive (5-10%); HER-2/neu not amplified; Ki-67 20%. 4. CT chest/abdomen/pelvis 02/28/2013 with mild thyroid enlargement and multiple low-attenuation lesions probably due to multinodular goiter; suggestion of  asymmetry in the deep lateral central left breast; cholelithiasis; small left renal cysts; degenerative changes throughout the spine; no lymphadenopathy in the abdomen or pelvis; no concerning solid organ lesions; mixed  lytic and sclerotic destructive lesion of the right acetabulum. 5. History of Elevated calcium level-likely related to taking a potassium supplement prior to the blood draws 6. Thyroid ultrasound 01/15/2018 - compared to June 2015-stable thyromegaly, bilateral nodules do not meet criteria for biopsy   Disposition: Cindy Frazier appears stable.  She will continue Arimidex.  She will receive Zometa today.  She will return for an office visit in 6 months. The shoulder discomfort is likely related to a benign musculoskeletal condition.  She will call for increased pain.  We will schedule a neck CT if she develops neck pain or new symptoms to suggest a cervical spine metastasis.  Betsy Coder, MD  06/17/2019  11:57 AM

## 2019-06-23 ENCOUNTER — Other Ambulatory Visit: Payer: Self-pay

## 2019-06-23 ENCOUNTER — Inpatient Hospital Stay: Payer: Medicare PPO

## 2019-06-23 VITALS — BP 150/96 | HR 78 | Temp 98.7°F | Resp 18 | Wt 142.8 lb

## 2019-06-23 DIAGNOSIS — Z79811 Long term (current) use of aromatase inhibitors: Secondary | ICD-10-CM | POA: Diagnosis not present

## 2019-06-23 DIAGNOSIS — C50911 Malignant neoplasm of unspecified site of right female breast: Secondary | ICD-10-CM

## 2019-06-23 DIAGNOSIS — Z170 Estrogen receptor positive status [ER+]: Secondary | ICD-10-CM | POA: Diagnosis not present

## 2019-06-23 DIAGNOSIS — Z9221 Personal history of antineoplastic chemotherapy: Secondary | ICD-10-CM | POA: Diagnosis not present

## 2019-06-23 DIAGNOSIS — Z9011 Acquired absence of right breast and nipple: Secondary | ICD-10-CM | POA: Diagnosis not present

## 2019-06-23 DIAGNOSIS — C7951 Secondary malignant neoplasm of bone: Secondary | ICD-10-CM | POA: Diagnosis not present

## 2019-06-23 DIAGNOSIS — Z923 Personal history of irradiation: Secondary | ICD-10-CM | POA: Diagnosis not present

## 2019-06-23 MED ORDER — ZOLEDRONIC ACID 4 MG/100ML IV SOLN
4.0000 mg | Freq: Once | INTRAVENOUS | Status: AC
  Administered 2019-06-23: 4 mg via INTRAVENOUS

## 2019-06-23 MED ORDER — SODIUM CHLORIDE 0.9% FLUSH
10.0000 mL | INTRAVENOUS | Status: DC | PRN
  Administered 2019-06-23: 10 mL
  Filled 2019-06-23: qty 10

## 2019-06-23 MED ORDER — SODIUM CHLORIDE 0.9 % IV SOLN
INTRAVENOUS | Status: DC
  Filled 2019-06-23: qty 250

## 2019-06-23 MED ORDER — ZOLEDRONIC ACID 4 MG/100ML IV SOLN
INTRAVENOUS | Status: AC
  Filled 2019-06-23: qty 100

## 2019-06-26 ENCOUNTER — Telehealth: Payer: Self-pay | Admitting: Oncology

## 2019-06-26 NOTE — Telephone Encounter (Signed)
Scheduled per 06/08 los, patient has been called and voicemail was left. 

## 2019-08-09 ENCOUNTER — Other Ambulatory Visit: Payer: Self-pay | Admitting: Oncology

## 2019-08-09 DIAGNOSIS — C50919 Malignant neoplasm of unspecified site of unspecified female breast: Secondary | ICD-10-CM

## 2019-09-12 ENCOUNTER — Other Ambulatory Visit: Payer: Self-pay | Admitting: *Deleted

## 2019-09-12 DIAGNOSIS — C7951 Secondary malignant neoplasm of bone: Secondary | ICD-10-CM

## 2019-09-16 ENCOUNTER — Other Ambulatory Visit: Payer: Self-pay

## 2019-09-16 ENCOUNTER — Inpatient Hospital Stay: Payer: Medicare PPO

## 2019-09-16 ENCOUNTER — Inpatient Hospital Stay: Payer: Medicare PPO | Attending: Oncology

## 2019-09-16 VITALS — BP 151/89 | HR 78 | Temp 98.6°F | Resp 16

## 2019-09-16 DIAGNOSIS — C50911 Malignant neoplasm of unspecified site of right female breast: Secondary | ICD-10-CM | POA: Insufficient documentation

## 2019-09-16 DIAGNOSIS — Z79811 Long term (current) use of aromatase inhibitors: Secondary | ICD-10-CM | POA: Diagnosis not present

## 2019-09-16 DIAGNOSIS — Z170 Estrogen receptor positive status [ER+]: Secondary | ICD-10-CM | POA: Diagnosis not present

## 2019-09-16 DIAGNOSIS — C7951 Secondary malignant neoplasm of bone: Secondary | ICD-10-CM | POA: Insufficient documentation

## 2019-09-16 LAB — BASIC METABOLIC PANEL - CANCER CENTER ONLY
Anion gap: 9 (ref 5–15)
BUN: 16 mg/dL (ref 8–23)
CO2: 26 mmol/L (ref 22–32)
Calcium: 9.7 mg/dL (ref 8.9–10.3)
Chloride: 104 mmol/L (ref 98–111)
Creatinine: 0.93 mg/dL (ref 0.44–1.00)
GFR, Est AFR Am: >60 mL/min (ref >60)
GFR, Estimated: >60 mL/min (ref >60)
Glucose, Bld: 196 mg/dL — ABNORMAL HIGH (ref 70–99)
Potassium: 4.1 mmol/L (ref 3.5–5.1)
Sodium: 139 mmol/L (ref 135–145)

## 2019-09-16 MED ORDER — ZOLEDRONIC ACID 4 MG/100ML IV SOLN
4.0000 mg | Freq: Once | INTRAVENOUS | Status: AC
  Administered 2019-09-16: 4 mg via INTRAVENOUS

## 2019-09-16 MED ORDER — ZOLEDRONIC ACID 4 MG/100ML IV SOLN
INTRAVENOUS | Status: AC
  Filled 2019-09-16: qty 100

## 2019-09-16 MED ORDER — SODIUM CHLORIDE 0.9 % IV SOLN
INTRAVENOUS | Status: DC
  Filled 2019-09-16: qty 250

## 2019-09-16 NOTE — Patient Instructions (Signed)
Zoledronic Acid injection (Hypercalcemia, Oncology) What is this medicine? ZOLEDRONIC ACID (ZOE le dron ik AS id) lowers the amount of calcium loss from bone. It is used to treat too much calcium in your blood from cancer. It is also used to prevent complications of cancer that has spread to the bone. This medicine may be used for other purposes; ask your health care provider or pharmacist if you have questions. COMMON BRAND NAME(S): Zometa What should I tell my health care provider before I take this medicine? They need to know if you have any of these conditions:  aspirin-sensitive asthma  cancer, especially if you are receiving medicines used to treat cancer  dental disease or wear dentures  infection  kidney disease  receiving corticosteroids like dexamethasone or prednisone  an unusual or allergic reaction to zoledronic acid, other medicines, foods, dyes, or preservatives  pregnant or trying to get pregnant  breast-feeding How should I use this medicine? This medicine is for infusion into a vein. It is given by a health care professional in a hospital or clinic setting. Talk to your pediatrician regarding the use of this medicine in children. Special care may be needed. Overdosage: If you think you have taken too much of this medicine contact a poison control center or emergency room at once. NOTE: This medicine is only for you. Do not share this medicine with others. What if I miss a dose? It is important not to miss your dose. Call your doctor or health care professional if you are unable to keep an appointment. What may interact with this medicine?  certain antibiotics given by injection  NSAIDs, medicines for pain and inflammation, like ibuprofen or naproxen  some diuretics like bumetanide, furosemide  teriparatide  thalidomide This list may not describe all possible interactions. Give your health care provider a list of all the medicines, herbs, non-prescription  drugs, or dietary supplements you use. Also tell them if you smoke, drink alcohol, or use illegal drugs. Some items may interact with your medicine. What should I watch for while using this medicine? Visit your doctor or health care professional for regular checkups. It may be some time before you see the benefit from this medicine. Do not stop taking your medicine unless your doctor tells you to. Your doctor may order blood tests or other tests to see how you are doing. Women should inform their doctor if they wish to become pregnant or think they might be pregnant. There is a potential for serious side effects to an unborn child. Talk to your health care professional or pharmacist for more information. You should make sure that you get enough calcium and vitamin D while you are taking this medicine. Discuss the foods you eat and the vitamins you take with your health care professional. Some people who take this medicine have severe bone, joint, and/or muscle pain. This medicine may also increase your risk for jaw problems or a broken thigh bone. Tell your doctor right away if you have severe pain in your jaw, bones, joints, or muscles. Tell your doctor if you have any pain that does not go away or that gets worse. Tell your dentist and dental surgeon that you are taking this medicine. You should not have major dental surgery while on this medicine. See your dentist to have a dental exam and fix any dental problems before starting this medicine. Take good care of your teeth while on this medicine. Make sure you see your dentist for regular follow-up   appointments. What side effects may I notice from receiving this medicine? Side effects that you should report to your doctor or health care professional as soon as possible:  allergic reactions like skin rash, itching or hives, swelling of the face, lips, or tongue  anxiety, confusion, or depression  breathing problems  changes in vision  eye  pain  feeling faint or lightheaded, falls  jaw pain, especially after dental work  mouth sores  muscle cramps, stiffness, or weakness  redness, blistering, peeling or loosening of the skin, including inside the mouth  trouble passing urine or change in the amount of urine Side effects that usually do not require medical attention (report to your doctor or health care professional if they continue or are bothersome):  bone, joint, or muscle pain  constipation  diarrhea  fever  hair loss  irritation at site where injected  loss of appetite  nausea, vomiting  stomach upset  trouble sleeping  trouble swallowing  weak or tired This list may not describe all possible side effects. Call your doctor for medical advice about side effects. You may report side effects to FDA at 1-800-FDA-1088. Where should I keep my medicine? This drug is given in a hospital or clinic and will not be stored at home. NOTE: This sheet is a summary. It may not cover all possible information. If you have questions about this medicine, talk to your doctor, pharmacist, or health care provider.  2020 Elsevier/Gold Standard (2013-05-24 14:19:39)  

## 2019-11-19 DIAGNOSIS — E7800 Pure hypercholesterolemia, unspecified: Secondary | ICD-10-CM | POA: Diagnosis not present

## 2019-11-19 DIAGNOSIS — C50911 Malignant neoplasm of unspecified site of right female breast: Secondary | ICD-10-CM | POA: Diagnosis not present

## 2019-11-19 DIAGNOSIS — Z1389 Encounter for screening for other disorder: Secondary | ICD-10-CM | POA: Diagnosis not present

## 2019-11-19 DIAGNOSIS — M25512 Pain in left shoulder: Secondary | ICD-10-CM | POA: Diagnosis not present

## 2019-11-19 DIAGNOSIS — C7951 Secondary malignant neoplasm of bone: Secondary | ICD-10-CM | POA: Diagnosis not present

## 2019-11-19 DIAGNOSIS — Z0000 Encounter for general adult medical examination without abnormal findings: Secondary | ICD-10-CM | POA: Diagnosis not present

## 2019-11-19 DIAGNOSIS — E1169 Type 2 diabetes mellitus with other specified complication: Secondary | ICD-10-CM | POA: Diagnosis not present

## 2019-11-19 DIAGNOSIS — I10 Essential (primary) hypertension: Secondary | ICD-10-CM | POA: Diagnosis not present

## 2019-11-21 DIAGNOSIS — E1169 Type 2 diabetes mellitus with other specified complication: Secondary | ICD-10-CM | POA: Diagnosis not present

## 2019-11-25 DIAGNOSIS — M7502 Adhesive capsulitis of left shoulder: Secondary | ICD-10-CM | POA: Diagnosis not present

## 2019-12-02 DIAGNOSIS — M25512 Pain in left shoulder: Secondary | ICD-10-CM | POA: Diagnosis not present

## 2019-12-02 DIAGNOSIS — M6281 Muscle weakness (generalized): Secondary | ICD-10-CM | POA: Diagnosis not present

## 2019-12-02 DIAGNOSIS — M25612 Stiffness of left shoulder, not elsewhere classified: Secondary | ICD-10-CM | POA: Diagnosis not present

## 2019-12-02 DIAGNOSIS — M7502 Adhesive capsulitis of left shoulder: Secondary | ICD-10-CM | POA: Diagnosis not present

## 2019-12-09 ENCOUNTER — Other Ambulatory Visit: Payer: Self-pay | Admitting: Neurology

## 2019-12-09 ENCOUNTER — Other Ambulatory Visit: Payer: Self-pay | Admitting: Oncology

## 2019-12-09 DIAGNOSIS — Z1231 Encounter for screening mammogram for malignant neoplasm of breast: Secondary | ICD-10-CM

## 2019-12-11 ENCOUNTER — Telehealth: Payer: Self-pay | Admitting: *Deleted

## 2019-12-11 DIAGNOSIS — M6281 Muscle weakness (generalized): Secondary | ICD-10-CM | POA: Diagnosis not present

## 2019-12-11 DIAGNOSIS — M25512 Pain in left shoulder: Secondary | ICD-10-CM | POA: Diagnosis not present

## 2019-12-11 DIAGNOSIS — M25612 Stiffness of left shoulder, not elsewhere classified: Secondary | ICD-10-CM | POA: Diagnosis not present

## 2019-12-11 DIAGNOSIS — M7502 Adhesive capsulitis of left shoulder: Secondary | ICD-10-CM | POA: Diagnosis not present

## 2019-12-11 NOTE — Telephone Encounter (Signed)
Asking if OK to have COVID booster close to Zometa infusion? Informed her that is no problem with that. She will return to pharmacy for [facility]. Reminded her not to receive Moderna too close to her mammogram due to possible false + results due to immune response.

## 2019-12-16 DIAGNOSIS — M7502 Adhesive capsulitis of left shoulder: Secondary | ICD-10-CM | POA: Diagnosis not present

## 2019-12-16 DIAGNOSIS — M25612 Stiffness of left shoulder, not elsewhere classified: Secondary | ICD-10-CM | POA: Diagnosis not present

## 2019-12-16 DIAGNOSIS — M25512 Pain in left shoulder: Secondary | ICD-10-CM | POA: Diagnosis not present

## 2019-12-16 DIAGNOSIS — M6281 Muscle weakness (generalized): Secondary | ICD-10-CM | POA: Diagnosis not present

## 2019-12-18 ENCOUNTER — Inpatient Hospital Stay: Payer: Medicare PPO

## 2019-12-18 ENCOUNTER — Inpatient Hospital Stay: Payer: Medicare PPO | Attending: Oncology | Admitting: Oncology

## 2019-12-18 ENCOUNTER — Other Ambulatory Visit: Payer: Self-pay

## 2019-12-18 VITALS — BP 119/74 | HR 66 | Temp 98.2°F | Resp 20

## 2019-12-18 VITALS — BP 141/73 | HR 72 | Temp 98.0°F | Resp 18 | Ht 62.0 in | Wt 132.8 lb

## 2019-12-18 DIAGNOSIS — Z79811 Long term (current) use of aromatase inhibitors: Secondary | ICD-10-CM | POA: Insufficient documentation

## 2019-12-18 DIAGNOSIS — C7951 Secondary malignant neoplasm of bone: Secondary | ICD-10-CM | POA: Insufficient documentation

## 2019-12-18 DIAGNOSIS — C50911 Malignant neoplasm of unspecified site of right female breast: Secondary | ICD-10-CM

## 2019-12-18 DIAGNOSIS — Z170 Estrogen receptor positive status [ER+]: Secondary | ICD-10-CM | POA: Diagnosis not present

## 2019-12-18 LAB — BASIC METABOLIC PANEL - CANCER CENTER ONLY
Anion gap: 13 (ref 5–15)
BUN: 17 mg/dL (ref 8–23)
CO2: 24 mmol/L (ref 22–32)
Calcium: 9.9 mg/dL (ref 8.9–10.3)
Chloride: 101 mmol/L (ref 98–111)
Creatinine: 0.88 mg/dL (ref 0.44–1.00)
GFR, Estimated: >60 mL/min (ref >60)
Glucose, Bld: 113 mg/dL — ABNORMAL HIGH (ref 70–99)
Potassium: 4 mmol/L (ref 3.5–5.1)
Sodium: 138 mmol/L (ref 135–145)

## 2019-12-18 MED ORDER — SODIUM CHLORIDE 0.9 % IV SOLN
INTRAVENOUS | Status: DC
  Filled 2019-12-18: qty 250

## 2019-12-18 MED ORDER — ZOLEDRONIC ACID 4 MG/100ML IV SOLN
4.0000 mg | Freq: Once | INTRAVENOUS | Status: AC
  Administered 2019-12-18: 4 mg via INTRAVENOUS

## 2019-12-18 MED ORDER — ZOLEDRONIC ACID 4 MG/100ML IV SOLN
INTRAVENOUS | Status: AC
  Filled 2019-12-18: qty 100

## 2019-12-18 NOTE — Progress Notes (Signed)
[facility] OFFICE PROGRESS NOTE   Diagnosis: Breast cancer  INTERVAL HISTORY:   Ms. Flater returns as scheduled.  She continues Arimidex.  She has been diagnosed with "tendinitis "at the left shoulder.  She received a steroid injection with improvement.  No other signs of pain.  She continues every 13-monthZometa.  Objective:  Vital signs in last 24 hours:  Blood pressure (!) 141/73, pulse 72, temperature 98 F (36.7 C), temperature source Tympanic, resp. rate 18, height _0  (1.575 m), weight 132 lb 12.8 oz (60.2 kg), SpO2 100 %.     Lymphatics: No cervical, supraclavicular, or axillary nodes Resp: Lungs clear bilaterally Cardio: Regular rate and rhythm GI: No hepatomegaly Vascular: No leg edema Breast: Status post right mastectomy with a TRAM reconstruction.  No evidence for chest wall tumor recurrence.  Left breast without mass Musculoskeletal: No pain with motion at the right hip.  Limited abduction and posterior extension at the left shoulder  Lab Results:   CMP  Lab Results  Component Value Date   NA 138 12/18/2019   K 4.0 12/18/2019   CL 101 12/18/2019   CO2 24 12/18/2019   GLUCOSE 113 (H) 12/18/2019   BUN 17 12/18/2019   CREATININE 0.88 12/18/2019   CALCIUM 9.9 12/18/2019   PROT 7.6 12/31/2015   ALBUMIN 4.0 12/31/2015   AST 14 12/31/2015   ALT 22 12/31/2015   ALKPHOS 57 12/31/2015   BILITOT 0.37 12/31/2015   GFRNONAA >60 12/18/2019   GFRAA >60 09/16/2019   Medications: I have reviewed the patient's current medications.   Assessment/Plan: 1. Breast cancer-stage IIA (T2 N0) right breast cancer diagnosed in January 2000, status post a right mastectomy and TRAM reconstruction. She completed adjuvant chemotherapy followed by 5 years of tamoxifen and then 5 years of Femara. She completed Femara at the end of July 2010.  2. Right groin pain with an MRI and bone scan February 2015 concerning for metastatic disease involving the right  acetabulum/ileum.  Cycle 1 Zometa 03/21/2013   Status post SBRT to the right acetabulum completed 03/26/2013   Initiation of Arimidex 03/27/2013.  Bone scan 09/23/2013. Metastatic disease in the right hemipelvis. Overall degree of activity decreased when compared to the prior exam.  Bone scan 04/21/2014. Right pelvic/acetabular uptake slightly decreased since the prior exam.  Bone scan 10/02/2014-unchanged metastasis at the right acetabulum, no new metastases, activity at the right greater than left mandible felt to reflect dental disease  Bone scan 06/18/2015-no change in the right acetabulum metastasis. No new metastases.  Bonescan 04/22/2016-no change in right acetabulum metastasis, no new metastases  CTs chest, abdomen, and pelvis on 12/28/2017- lytic/sclerotic lesion of the right acetabulum, no other bone lesions, no other evidence of metastatic disease in the chest, abdomen, and pelvis, multiple thyroid nodules  Bone scan 12/17/2018-new focal area of increased activity at the mid right cervical spine-felt to most likely be degenerative after review with radiology, unchanged activity at the right acetabulum 3. Status post CT guided right superior acetabular bone biopsy on 02/28/2013. Pathology showed metastatic carcinoma consistent with breast primary; ER positive (90-100%); PR weakly positive (5-10%); HER-2/neu not amplified; Ki-67 20%. 4. CT chest/abdomen/pelvis 02/28/2013 with mild thyroid enlargement and multiple low-attenuation lesions probably due to multinodular goiter; suggestion of asymmetry in the deep lateral central left breast; cholelithiasis; small left renal cysts; degenerative changes throughout the spine; no lymphadenopathy in the abdomen or pelvis; no concerning solid organ lesions; mixed lytic and sclerotic destructive lesion of the right  acetabulum. 5. History of Elevated calcium level-likely related to taking a potassium supplement prior to the blood  draws 6. Thyroid ultrasound 01/15/2018 - compared to June 2015-stable thyromegaly, bilateral nodules do not meet criteria for biopsy     Disposition: Mr. Dema Severin appears stable.  There is no clinical evidence for progression of breast cancer.  She will continue Arimidex.  She will contact us if the left shoulder pain does not improve following the steroid injection and physical therapy.  She will continue every 69-monthZometa.  She will return for an office visit in 6 months.  We discussed the indication for a restaging bone scan.  She would like to hold on this for now.  GBetsy Coder MD  12/18/2019  1:31 PM

## 2019-12-18 NOTE — Patient Instructions (Signed)
Zoledronic Acid injection (Hypercalcemia, Oncology) What is this medicine? ZOLEDRONIC ACID (ZOE le dron ik AS id) lowers the amount of calcium loss from bone. It is used to treat too much calcium in your blood from cancer. It is also used to prevent complications of cancer that has spread to the bone. This medicine may be used for other purposes; ask your health care provider or pharmacist if you have questions. COMMON BRAND NAME(S): Zometa What should I tell my health care provider before I take this medicine? They need to know if you have any of these conditions:  aspirin-sensitive asthma  cancer, especially if you are receiving medicines used to treat cancer  dental disease or wear dentures  infection  kidney disease  receiving corticosteroids like dexamethasone or prednisone  an unusual or allergic reaction to zoledronic acid, other medicines, foods, dyes, or preservatives  pregnant or trying to get pregnant  breast-feeding How should I use this medicine? This medicine is for infusion into a vein. It is given by a health care professional in a hospital or clinic setting. Talk to your pediatrician regarding the use of this medicine in children. Special care may be needed. Overdosage: If you think you have taken too much of this medicine contact a poison control center or emergency room at once. NOTE: This medicine is only for you. Do not share this medicine with others. What if I miss a dose? It is important not to miss your dose. Call your doctor or health care professional if you are unable to keep an appointment. What may interact with this medicine?  certain antibiotics given by injection  NSAIDs, medicines for pain and inflammation, like ibuprofen or naproxen  some diuretics like bumetanide, furosemide  teriparatide  thalidomide This list may not describe all possible interactions. Give your health care provider a list of all the medicines, herbs, non-prescription  drugs, or dietary supplements you use. Also tell them if you smoke, drink alcohol, or use illegal drugs. Some items may interact with your medicine. What should I watch for while using this medicine? Visit your doctor or health care professional for regular checkups. It may be some time before you see the benefit from this medicine. Do not stop taking your medicine unless your doctor tells you to. Your doctor may order blood tests or other tests to see how you are doing. Women should inform their doctor if they wish to become pregnant or think they might be pregnant. There is a potential for serious side effects to an unborn child. Talk to your health care professional or pharmacist for more information. You should make sure that you get enough calcium and vitamin D while you are taking this medicine. Discuss the foods you eat and the vitamins you take with your health care professional. Some people who take this medicine have severe bone, joint, and/or muscle pain. This medicine may also increase your risk for jaw problems or a broken thigh bone. Tell your doctor right away if you have severe pain in your jaw, bones, joints, or muscles. Tell your doctor if you have any pain that does not go away or that gets worse. Tell your dentist and dental surgeon that you are taking this medicine. You should not have major dental surgery while on this medicine. See your dentist to have a dental exam and fix any dental problems before starting this medicine. Take good care of your teeth while on this medicine. Make sure you see your dentist for regular follow-up   appointments. What side effects may I notice from receiving this medicine? Side effects that you should report to your doctor or health care professional as soon as possible:  allergic reactions like skin rash, itching or hives, swelling of the face, lips, or tongue  anxiety, confusion, or depression  breathing problems  changes in vision  eye  pain  feeling faint or lightheaded, falls  jaw pain, especially after dental work  mouth sores  muscle cramps, stiffness, or weakness  redness, blistering, peeling or loosening of the skin, including inside the mouth  trouble passing urine or change in the amount of urine Side effects that usually do not require medical attention (report to your doctor or health care professional if they continue or are bothersome):  bone, joint, or muscle pain  constipation  diarrhea  fever  hair loss  irritation at site where injected  loss of appetite  nausea, vomiting  stomach upset  trouble sleeping  trouble swallowing  weak or tired This list may not describe all possible side effects. Call your doctor for medical advice about side effects. You may report side effects to FDA at 1-800-FDA-1088. Where should I keep my medicine? This drug is given in a hospital or clinic and will not be stored at home. NOTE: This sheet is a summary. It may not cover all possible information. If you have questions about this medicine, talk to your doctor, pharmacist, or health care provider.  2020 Elsevier/Gold Standard (2013-05-24 14:19:39)  

## 2019-12-18 NOTE — Progress Notes (Signed)
Pt. stable for discharge. Left via ambulation, no respiratory distress noted. 

## 2019-12-23 DIAGNOSIS — M7502 Adhesive capsulitis of left shoulder: Secondary | ICD-10-CM | POA: Diagnosis not present

## 2019-12-23 DIAGNOSIS — M25612 Stiffness of left shoulder, not elsewhere classified: Secondary | ICD-10-CM | POA: Diagnosis not present

## 2019-12-23 DIAGNOSIS — M6281 Muscle weakness (generalized): Secondary | ICD-10-CM | POA: Diagnosis not present

## 2019-12-23 DIAGNOSIS — M25512 Pain in left shoulder: Secondary | ICD-10-CM | POA: Diagnosis not present

## 2019-12-25 DIAGNOSIS — M25512 Pain in left shoulder: Secondary | ICD-10-CM | POA: Diagnosis not present

## 2019-12-25 DIAGNOSIS — M25612 Stiffness of left shoulder, not elsewhere classified: Secondary | ICD-10-CM | POA: Diagnosis not present

## 2019-12-25 DIAGNOSIS — M7502 Adhesive capsulitis of left shoulder: Secondary | ICD-10-CM | POA: Diagnosis not present

## 2019-12-25 DIAGNOSIS — M6281 Muscle weakness (generalized): Secondary | ICD-10-CM | POA: Diagnosis not present

## 2019-12-30 DIAGNOSIS — M7502 Adhesive capsulitis of left shoulder: Secondary | ICD-10-CM | POA: Diagnosis not present

## 2019-12-30 DIAGNOSIS — M25612 Stiffness of left shoulder, not elsewhere classified: Secondary | ICD-10-CM | POA: Diagnosis not present

## 2019-12-30 DIAGNOSIS — M6281 Muscle weakness (generalized): Secondary | ICD-10-CM | POA: Diagnosis not present

## 2019-12-30 DIAGNOSIS — M25512 Pain in left shoulder: Secondary | ICD-10-CM | POA: Diagnosis not present

## 2020-01-01 DIAGNOSIS — M7502 Adhesive capsulitis of left shoulder: Secondary | ICD-10-CM | POA: Diagnosis not present

## 2020-01-01 DIAGNOSIS — M25612 Stiffness of left shoulder, not elsewhere classified: Secondary | ICD-10-CM | POA: Diagnosis not present

## 2020-01-01 DIAGNOSIS — M6281 Muscle weakness (generalized): Secondary | ICD-10-CM | POA: Diagnosis not present

## 2020-01-13 DIAGNOSIS — M6281 Muscle weakness (generalized): Secondary | ICD-10-CM | POA: Diagnosis not present

## 2020-01-13 DIAGNOSIS — M25612 Stiffness of left shoulder, not elsewhere classified: Secondary | ICD-10-CM | POA: Diagnosis not present

## 2020-01-13 DIAGNOSIS — M7502 Adhesive capsulitis of left shoulder: Secondary | ICD-10-CM | POA: Diagnosis not present

## 2020-01-13 DIAGNOSIS — M25512 Pain in left shoulder: Secondary | ICD-10-CM | POA: Diagnosis not present

## 2020-01-13 DIAGNOSIS — S46012A Strain of muscle(s) and tendon(s) of the rotator cuff of left shoulder, initial encounter: Secondary | ICD-10-CM | POA: Diagnosis not present

## 2020-01-19 DIAGNOSIS — M25512 Pain in left shoulder: Secondary | ICD-10-CM | POA: Diagnosis not present

## 2020-01-20 ENCOUNTER — Ambulatory Visit: Payer: Medicare PPO

## 2020-02-03 ENCOUNTER — Other Ambulatory Visit: Payer: Self-pay

## 2020-02-03 ENCOUNTER — Ambulatory Visit
Admission: RE | Admit: 2020-02-03 | Discharge: 2020-02-03 | Disposition: A | Discharge status: Home / Self Care | Payer: Medicare PPO | Source: Ambulatory Visit | Attending: Oncology | Admitting: Oncology

## 2020-02-03 ENCOUNTER — Other Ambulatory Visit: Payer: Self-pay | Admitting: Oncology

## 2020-02-03 DIAGNOSIS — Z1231 Encounter for screening mammogram for malignant neoplasm of breast: Secondary | ICD-10-CM

## 2020-03-02 DIAGNOSIS — E1169 Type 2 diabetes mellitus with other specified complication: Secondary | ICD-10-CM | POA: Diagnosis not present

## 2020-03-03 DIAGNOSIS — H40023 Open angle with borderline findings, high risk, bilateral: Secondary | ICD-10-CM | POA: Diagnosis not present

## 2020-03-03 DIAGNOSIS — H25813 Combined forms of age-related cataract, bilateral: Secondary | ICD-10-CM | POA: Diagnosis not present

## 2020-03-03 DIAGNOSIS — H53021 Refractive amblyopia, right eye: Secondary | ICD-10-CM | POA: Diagnosis not present

## 2020-03-17 ENCOUNTER — Inpatient Hospital Stay: Payer: Medicare PPO

## 2020-03-17 ENCOUNTER — Other Ambulatory Visit: Payer: Self-pay

## 2020-03-17 ENCOUNTER — Inpatient Hospital Stay: Payer: Medicare PPO | Attending: Oncology

## 2020-03-17 VITALS — BP 149/74 | HR 66 | Temp 98.8°F | Resp 18 | Wt 140.4 lb

## 2020-03-17 DIAGNOSIS — Z170 Estrogen receptor positive status [ER+]: Secondary | ICD-10-CM | POA: Diagnosis not present

## 2020-03-17 DIAGNOSIS — C50911 Malignant neoplasm of unspecified site of right female breast: Secondary | ICD-10-CM | POA: Insufficient documentation

## 2020-03-17 DIAGNOSIS — Z79899 Other long term (current) drug therapy: Secondary | ICD-10-CM | POA: Insufficient documentation

## 2020-03-17 DIAGNOSIS — C7951 Secondary malignant neoplasm of bone: Secondary | ICD-10-CM

## 2020-03-17 LAB — BASIC METABOLIC PANEL - CANCER CENTER ONLY
Anion gap: 11 (ref 5–15)
BUN: 19 mg/dL (ref 8–23)
CO2: 29 mmol/L (ref 22–32)
Calcium: 10.4 mg/dL — ABNORMAL HIGH (ref 8.9–10.3)
Chloride: 100 mmol/L (ref 98–111)
Creatinine: 0.97 mg/dL (ref 0.44–1.00)
GFR, Estimated: >60 mL/min (ref >60)
Glucose, Bld: 152 mg/dL — ABNORMAL HIGH (ref 70–99)
Potassium: 3.8 mmol/L (ref 3.5–5.1)
Sodium: 140 mmol/L (ref 135–145)

## 2020-03-17 MED ORDER — SODIUM CHLORIDE 0.9 % IV SOLN
INTRAVENOUS | Status: DC
  Filled 2020-03-17: qty 250

## 2020-03-17 MED ORDER — ZOLEDRONIC ACID 4 MG/100ML IV SOLN
4.0000 mg | Freq: Once | INTRAVENOUS | Status: AC
  Administered 2020-03-17: 4 mg via INTRAVENOUS

## 2020-03-17 MED ORDER — ZOLEDRONIC ACID 4 MG/100ML IV SOLN
INTRAVENOUS | Status: AC
  Filled 2020-03-17: qty 100

## 2020-03-17 NOTE — Patient Instructions (Signed)
[facility] Discharge Instructions for Patients Receiving Chemotherapy  Today you received the following chemotherapy agents Zometa/Zoledronic Acid  To help prevent nausea and vomiting after your treatment, we encourage you to take your nausea medication as directed   If you develop nausea and vomiting that is not controlled by your nausea medication, call the clinic.   BELOW ARE SYMPTOMS THAT SHOULD BE REPORTED IMMEDIATELY:  *FEVER GREATER THAN 100.5 F  *CHILLS WITH OR WITHOUT FEVER  NAUSEA AND VOMITING THAT IS NOT CONTROLLED WITH YOUR NAUSEA MEDICATION  *UNUSUAL SHORTNESS OF BREATH  *UNUSUAL BRUISING OR BLEEDING  TENDERNESS IN MOUTH AND THROAT WITH OR WITHOUT PRESENCE OF ULCERS  *URINARY PROBLEMS  *BOWEL PROBLEMS  UNUSUAL RASH Items with * indicate a potential emergency and should be followed up as soon as possible.  Feel free to call the clinic should you have any questions or concerns. The clinic phone number is [phone].  Please show the [place] at check-in to the Emergency Department and triage nurse.

## 2020-05-18 DIAGNOSIS — E042 Nontoxic multinodular goiter: Secondary | ICD-10-CM | POA: Diagnosis not present

## 2020-05-18 DIAGNOSIS — C50919 Malignant neoplasm of unspecified site of unspecified female breast: Secondary | ICD-10-CM | POA: Diagnosis not present

## 2020-05-18 DIAGNOSIS — I10 Essential (primary) hypertension: Secondary | ICD-10-CM | POA: Diagnosis not present

## 2020-05-18 DIAGNOSIS — E7800 Pure hypercholesterolemia, unspecified: Secondary | ICD-10-CM | POA: Diagnosis not present

## 2020-05-18 DIAGNOSIS — E1169 Type 2 diabetes mellitus with other specified complication: Secondary | ICD-10-CM | POA: Diagnosis not present

## 2020-05-18 DIAGNOSIS — C7951 Secondary malignant neoplasm of bone: Secondary | ICD-10-CM | POA: Diagnosis not present

## 2020-06-14 IMAGING — MG DIGITAL DIAGNOSTIC UNILATERAL LEFT MAMMOGRAM WITH TOMO AND CAD
4 series · 4 of 12 positions shown · non-contrast
Comparison: Previous exam(s).

CLINICAL DATA: Screening recall for a left breast asymmetry.

EXAM:
DIGITAL DIAGNOSTIC UNILATERAL LEFT MAMMOGRAM WITH CAD AND TOMO

[L CC synth-2D]
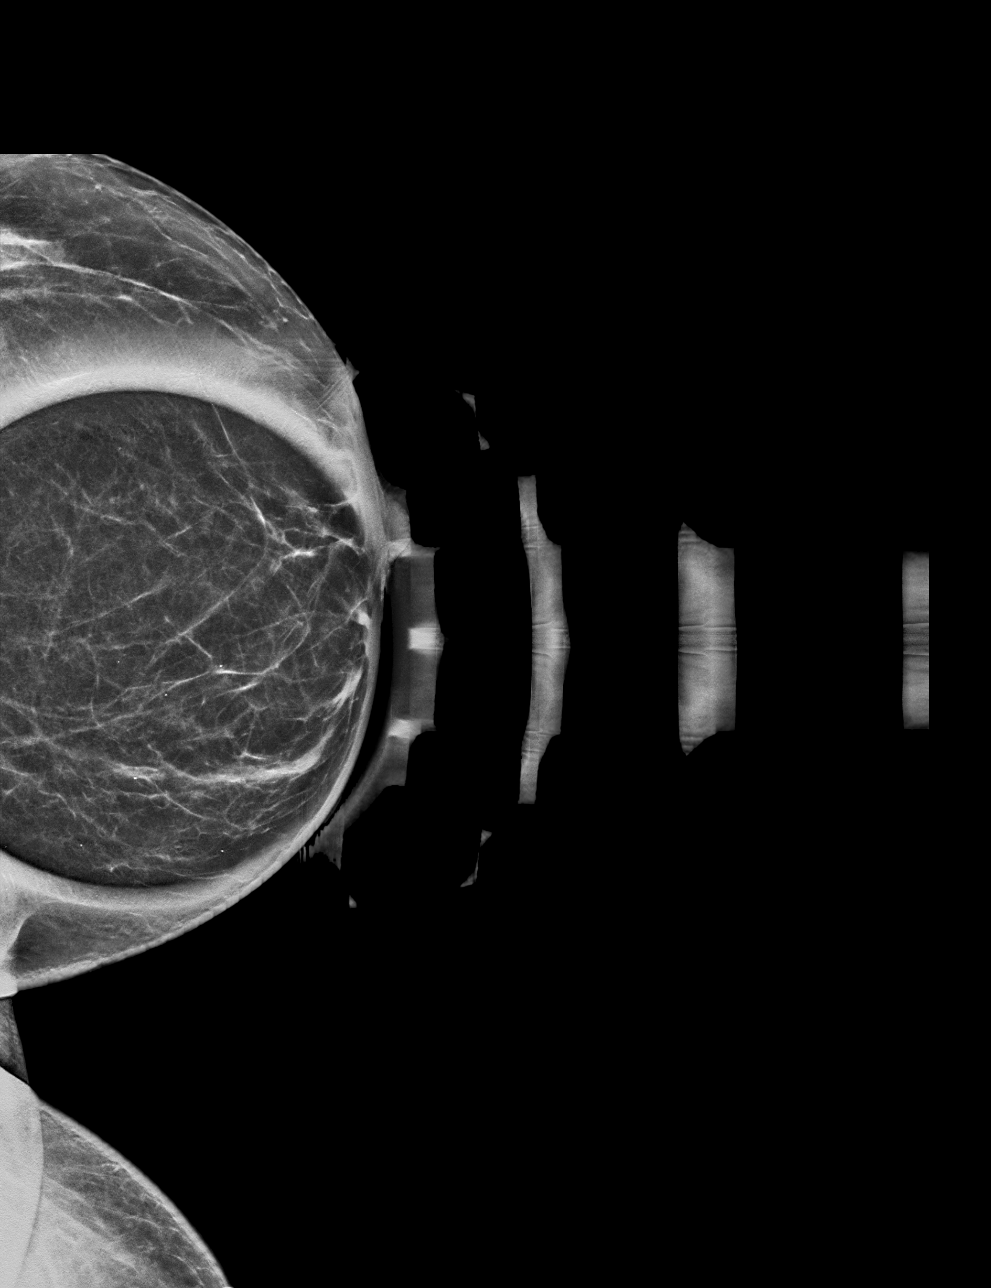

[L ML synth-2D]
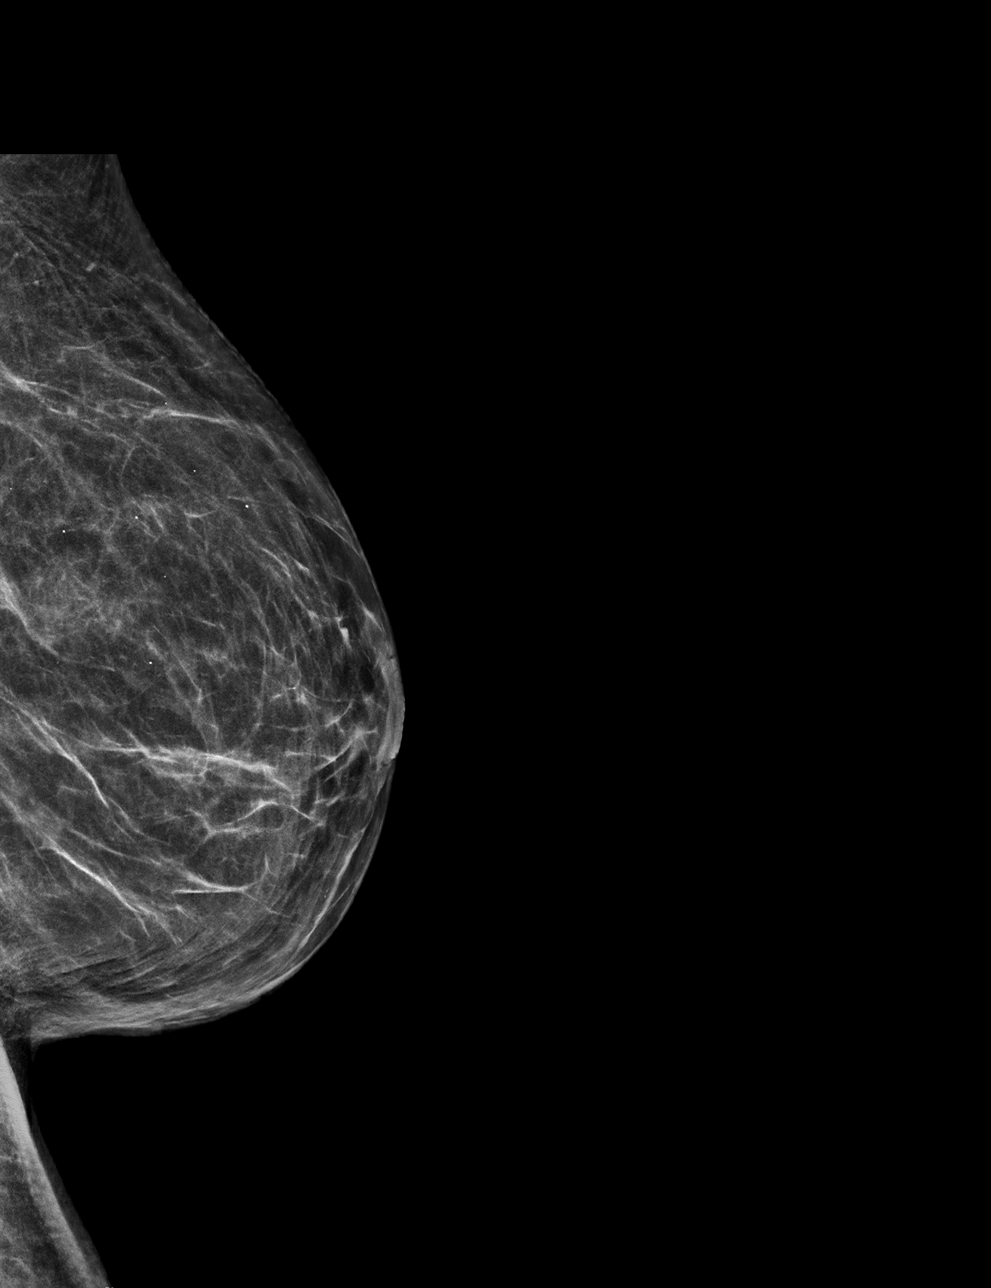

[L ML tomo · tomo slice 30/59.0]
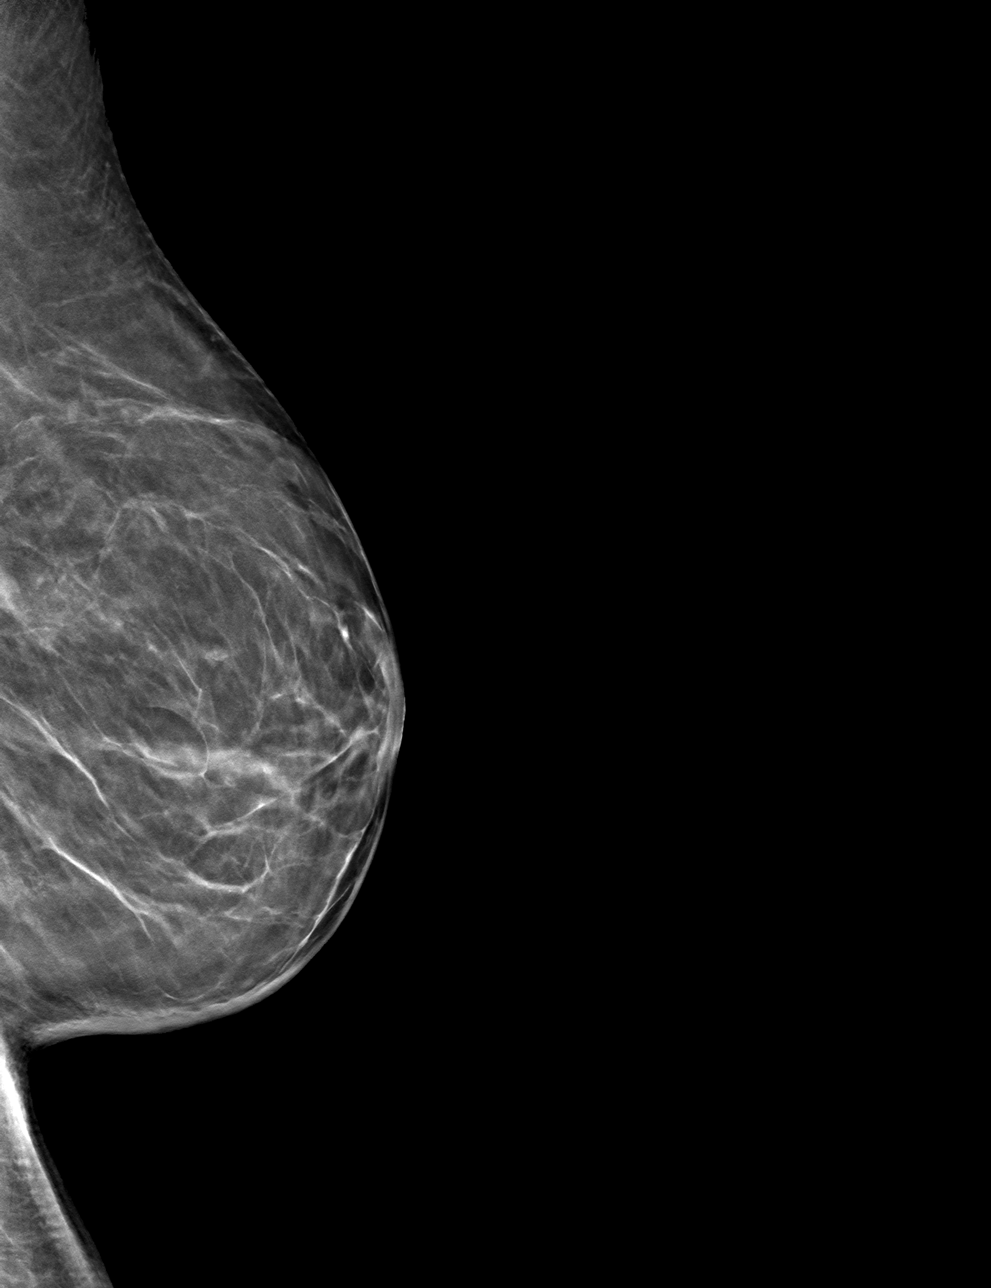

[L CC tomo · tomo slice 25/50.0]
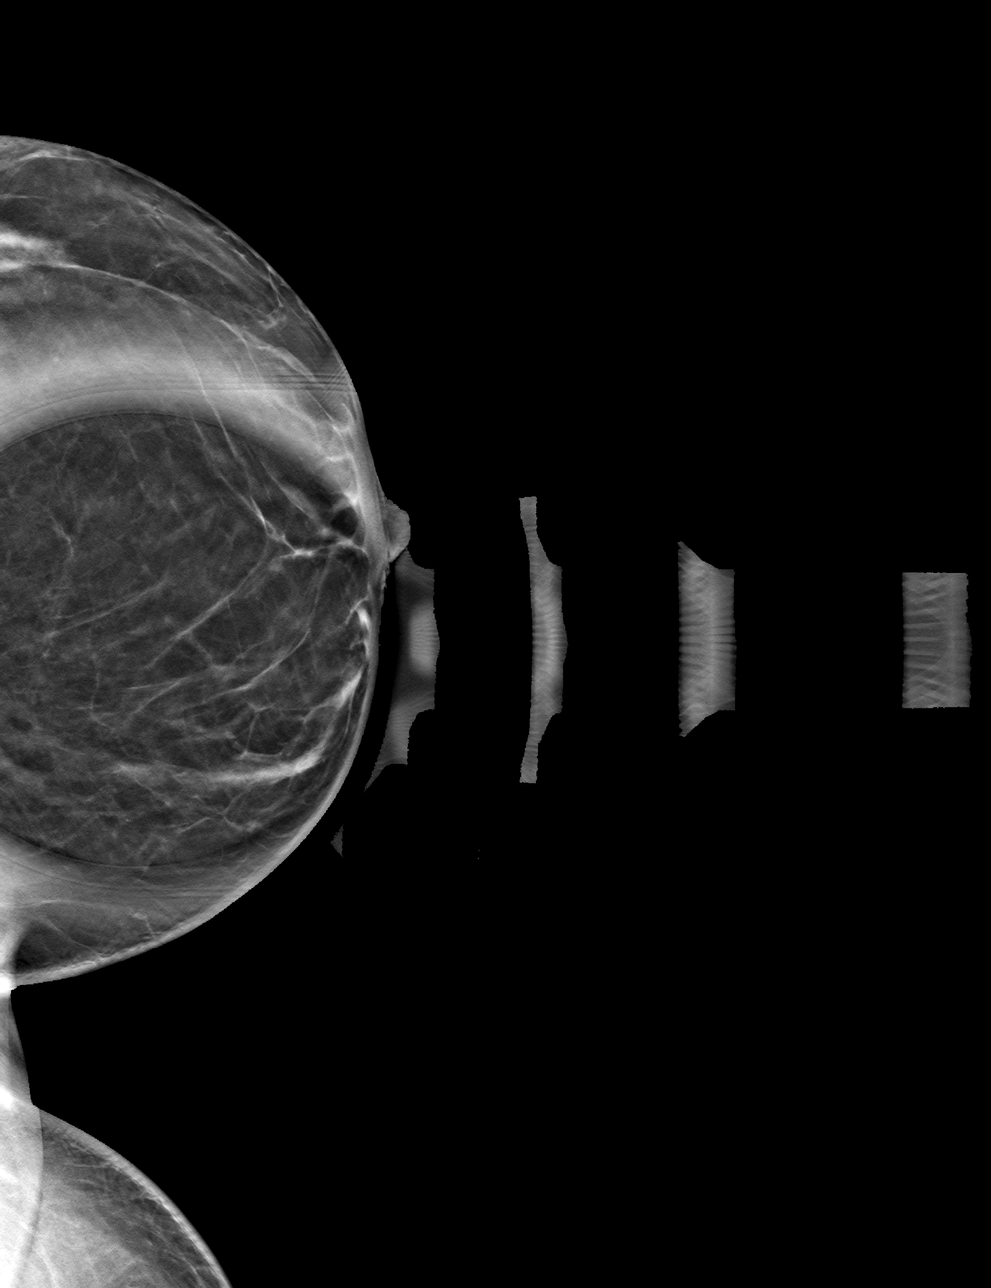

[4 of 12 positions shown; findings below may reference images not displayed]

ACR Breast Density Category b: There are scattered areas of
fibroglandular density.
FINDINGS: The asymmetry previously seen in the medial aspect of the left
breast on the CC view resolves on spot compression tomosynthesis
imaging. No suspicious calcifications, masses or areas of distortion
are seen in the left breast.

Mammographic images were processed with CAD.
IMPRESSION: Resolution of the left breast asymmetry consistent with overlapping
fibroglandular tissue.

RECOMMENDATION:
Screening mammogram in one year.(Code:[id number])

I have discussed the findings and recommendations with the patient.
Results were also provided in writing at the conclusion of the
visit. If applicable, a reminder letter will be sent to the patient
regarding the next appointment.

BI-RADS CATEGORY  1: Negative.

## 2020-06-15 ENCOUNTER — Inpatient Hospital Stay: Payer: Medicare PPO | Attending: Oncology | Admitting: Nurse Practitioner

## 2020-06-15 ENCOUNTER — Encounter: Payer: Self-pay | Admitting: Nurse Practitioner

## 2020-06-15 ENCOUNTER — Ambulatory Visit (HOSPITAL_BASED_OUTPATIENT_CLINIC_OR_DEPARTMENT_OTHER)
Admission: RE | Admit: 2020-06-15 | Discharge: 2020-06-15 | Disposition: A | Discharge status: Home / Self Care | Payer: Medicare PPO | Source: Ambulatory Visit | Attending: Nurse Practitioner | Admitting: Nurse Practitioner

## 2020-06-15 ENCOUNTER — Ambulatory Visit: Payer: Medicare PPO

## 2020-06-15 ENCOUNTER — Inpatient Hospital Stay: Payer: Medicare PPO

## 2020-06-15 ENCOUNTER — Other Ambulatory Visit: Payer: Medicare PPO

## 2020-06-15 ENCOUNTER — Ambulatory Visit: Payer: Medicare PPO | Admitting: Oncology

## 2020-06-15 ENCOUNTER — Other Ambulatory Visit: Payer: Self-pay

## 2020-06-15 VITALS — BP 131/69 | HR 71 | Temp 97.8°F | Resp 20 | Ht 62.0 in | Wt 141.2 lb

## 2020-06-15 DIAGNOSIS — C50911 Malignant neoplasm of unspecified site of right female breast: Secondary | ICD-10-CM | POA: Diagnosis not present

## 2020-06-15 DIAGNOSIS — C7951 Secondary malignant neoplasm of bone: Secondary | ICD-10-CM | POA: Insufficient documentation

## 2020-06-15 DIAGNOSIS — Z170 Estrogen receptor positive status [ER+]: Secondary | ICD-10-CM | POA: Diagnosis not present

## 2020-06-15 DIAGNOSIS — M25551 Pain in right hip: Secondary | ICD-10-CM | POA: Diagnosis not present

## 2020-06-15 DIAGNOSIS — Z79811 Long term (current) use of aromatase inhibitors: Secondary | ICD-10-CM | POA: Insufficient documentation

## 2020-06-15 DIAGNOSIS — Z9221 Personal history of antineoplastic chemotherapy: Secondary | ICD-10-CM | POA: Diagnosis not present

## 2020-06-15 LAB — BASIC METABOLIC PANEL - CANCER CENTER ONLY
Anion gap: 10 (ref 5–15)
BUN: 23 mg/dL (ref 8–23)
CO2: 26 mmol/L (ref 22–32)
Calcium: 9.8 mg/dL (ref 8.9–10.3)
Chloride: 101 mmol/L (ref 98–111)
Creatinine: 0.85 mg/dL (ref 0.44–1.00)
GFR, Estimated: >60 mL/min (ref >60)
Glucose, Bld: 131 mg/dL — ABNORMAL HIGH (ref 70–99)
Potassium: 4 mmol/L (ref 3.5–5.1)
Sodium: 137 mmol/L (ref 135–145)

## 2020-06-15 MED ORDER — ZOLEDRONIC ACID 4 MG/100ML IV SOLN
4.0000 mg | Freq: Once | INTRAVENOUS | Status: AC
  Administered 2020-06-15: 4 mg via INTRAVENOUS
  Filled 2020-06-15: qty 100

## 2020-06-15 MED ORDER — SODIUM CHLORIDE 0.9 % IV SOLN
INTRAVENOUS | Status: DC
  Filled 2020-06-15: qty 250

## 2020-06-15 NOTE — Progress Notes (Signed)
[facility] OFFICE PROGRESS NOTE   Diagnosis: Breast cancer  INTERVAL HISTORY:   Cindy Frazier returns as scheduled.  She continues Arimidex.  She continues every 50-monthZometa.  No dental issues or jaw pain.  About 2 weeks ago she had an episode of right hip pain.  The pain resolved over the course of a day.  No known injury.  No hot flashes.  No significant joint pains.  Objective:  Vital signs in last 24 hours:  Blood pressure 131/69, pulse 71, temperature 97.8 F (36.6 C), temperature source Oral, resp. rate 20, height 5' 2"  (1.575 m), weight 141 lb 3.2 oz (64 kg), SpO2 99 %.    HEENT: No thrush or ulcers. Lymphatics: No palpable cervical, supraclavicular, axillary or inguinal lymph nodes. Resp: Lungs clear bilaterally. Cardio: Regular rate and rhythm. GI: Abdomen soft and nontender.  No hepatomegaly. Vascular: No leg edema. Breast: Status post right mastectomy with TRAM reconstruction.  No evidence for chest wall tumor recurrence.  No mass palpated in the left breast.   Lab Results:  Lab Results  Component Value Date   WBC 7.2 02/21/2013   HGB 13.8 02/21/2013   HCT 43.9 02/21/2013   MCV 83.5 02/21/2013   PLT 291 02/21/2013   NEUTROABS 4.2 02/21/2013    Imaging:  No results found.  Medications: I have reviewed the patient's current medications.  Assessment/Plan: 1. Breast cancer-stage IIA (T2 N0) right breast cancer diagnosed in January 2000, status post a right mastectomy and TRAM reconstruction. She completed adjuvant chemotherapy followed by 5 years of tamoxifen and then 5 years of Femara. She completed Femara at the end of July 2010.  2. Right groin pain with an MRI and bone scan February 2015 concerning for metastatic disease involving the right acetabulum/ileum.  Cycle 1 Zometa 03/21/2013   Status post SBRT to the right acetabulum completed 03/26/2013   Initiation of Arimidex 03/27/2013.  Bone scan 09/23/2013. Metastatic disease in  the right hemipelvis. Overall degree of activity decreased when compared to the prior exam.  Bone scan 04/21/2014. Right pelvic/acetabular uptake slightly decreased since the prior exam.  Bone scan 10/02/2014-unchanged metastasis at the right acetabulum, no new metastases, activity at the right greater than left mandible felt to reflect dental disease  Bone scan 06/18/2015-no change in the right acetabulum metastasis. No new metastases.  Bonescan 04/22/2016-no change in right acetabulum metastasis, no new metastases  CTs chest, abdomen, and pelvis on 12/28/2017- lytic/sclerotic lesion of the right acetabulum, no other bone lesions, no other evidence of metastatic disease in the chest, abdomen, and pelvis, multiple thyroid nodules  Bone scan 12/17/2018-new focal area of increased activity at the mid right cervical spine-felt to most likely be degenerative after review with radiology, unchanged activity at the right acetabulum 3. Status post CT guided right superior acetabular bone biopsy on 02/28/2013. Pathology showed metastatic carcinoma consistent with breast primary; ER positive (90-100%); PR weakly positive (5-10%); HER-2/neu not amplified; Ki-67 20%. 4. CT chest/abdomen/pelvis 02/28/2013 with mild thyroid enlargement and multiple low-attenuation lesions probably due to multinodular goiter; suggestion of asymmetry in the deep lateral central left breast; cholelithiasis; small left renal cysts; degenerative changes throughout the spine; no lymphadenopathy in the abdomen or pelvis; no concerning solid organ lesions; mixed lytic and sclerotic destructive lesion of the right acetabulum. 5. History of Elevated calcium level-likely related to taking a potassium supplement prior to the blood draws 6. Thyroid ultrasound 01/15/2018 - compared to June 2015-stable thyromegaly, bilateral nodules do not meet criteria for  biopsy    Disposition: Cindy Frazier appears stable.  She continues Arimidex.  Plan  for Zometa today.    She had an episode of right hip pain a few weeks ago.  We decided to go ahead with a plain x-ray today.  We also discussed a restaging bone scan.  She would like to have the x-ray results prior to scheduling the bone scan.  She will return for lab and Zometa in 3 months.  We will see her in follow-up in 6 months, sooner if needed pending x-ray result.    [name] ANP/GNP-BC   06/15/2020  10:35 AM

## 2020-06-15 NOTE — Patient Instructions (Signed)
Zoledronic Acid Injection (Hypercalcemia, Oncology) What is this medicine? ZOLEDRONIC ACID (ZOE le dron ik AS id) slows calcium loss from bones. It high calcium levels in the blood from some kinds of cancer. It may be used in other people at risk for bone loss. This medicine may be used for other purposes; ask your health care provider or pharmacist if you have questions. COMMON BRAND NAME(S): Zometa What should I tell my health care provider before I take this medicine? They need to know if you have any of these conditions:  cancer  dehydration  dental disease  kidney disease  liver disease  low levels of calcium in the blood  lung or breathing disease (asthma)  receiving steroids like dexamethasone or prednisone  an unusual or allergic reaction to zoledronic acid, other medicines, foods, dyes, or preservatives  pregnant or trying to get pregnant  breast-feeding How should I use this medicine? This drug is injected into a vein. It is given by a health care provider in a hospital or clinic setting. Talk to your health care provider about the use of this drug in children. Special care may be needed. Overdosage: If you think you have taken too much of this medicine contact a poison control center or emergency room at once. NOTE: This medicine is only for you. Do not share this medicine with others. What if I miss a dose? Keep appointments for follow-up doses. It is important not to miss your dose. Call your health care provider if you are unable to keep an appointment. What may interact with this medicine?  certain antibiotics given by injection  NSAIDs, medicines for pain and inflammation, like ibuprofen or naproxen  some diuretics like bumetanide, furosemide  teriparatide  thalidomide This list may not describe all possible interactions. Give your health care provider a list of all the medicines, herbs, non-prescription drugs, or dietary supplements you use. Also tell  them if you smoke, drink alcohol, or use illegal drugs. Some items may interact with your medicine. What should I watch for while using this medicine? Visit your health care provider for regular checks on your progress. It may be some time before you see the benefit from this drug. Some people who take this drug have severe bone, joint, or muscle pain. This drug may also increase your risk for jaw problems or a broken thigh bone. Tell your health care provider right away if you have severe pain in your jaw, bones, joints, or muscles. Tell you health care provider if you have any pain that does not go away or that gets worse. Tell your dentist and dental surgeon that you are taking this drug. You should not have major dental surgery while on this drug. See your dentist to have a dental exam and fix any dental problems before starting this drug. Take good care of your teeth while on this drug. Make sure you see your dentist for regular follow-up appointments. You should make sure you get enough calcium and vitamin D while you are taking this drug. Discuss the foods you eat and the vitamins you take with your health care provider. Check with your health care provider if you have severe diarrhea, nausea, and vomiting, or if you sweat a lot. The loss of too much body fluid may make it dangerous for you to take this drug. You may need blood work done while you are taking this drug. Do not become pregnant while taking this drug. Women should inform their health care provider   if they wish to become pregnant or think they might be pregnant. There is potential for serious harm to an unborn child. Talk to your health care provider for more information. What side effects may I notice from receiving this medicine? Side effects that you should report to your doctor or health care provider as soon as possible:  allergic reactions (skin rash, itching or hives; swelling of the face, lips, or tongue)  bone  pain  infection (fever, chills, cough, sore throat, pain or trouble passing urine)  jaw pain, especially after dental work  joint pain  kidney injury (trouble passing urine or change in the amount of urine)  low blood pressure (dizziness; feeling faint or lightheaded, falls; unusually weak or tired)  low calcium levels (fast heartbeat; muscle cramps or pain; pain, tingling, or numbness in the hands or feet; seizures)  low magnesium levels (fast, irregular heartbeat; muscle cramp or pain; muscle weakness; tremors; seizures)  low red blood cell counts (trouble breathing; feeling faint; lightheaded, falls; unusually weak or tired)  muscle pain  redness, blistering, peeling, or loosening of the skin, including inside the mouth  severe diarrhea  swelling of the ankles, feet, hands  trouble breathing Side effects that usually do not require medical attention (report to your doctor or health care provider if they continue or are bothersome):  anxious  constipation  coughing  depressed mood  eye irritation, itching, or pain  fever  general ill feeling or flu-like symptoms  nausea  pain, redness, or irritation at site where injected  trouble sleeping This list may not describe all possible side effects. Call your doctor for medical advice about side effects. You may report side effects to FDA at 1-800-FDA-1088. Where should I keep my medicine? This drug is given in a hospital or clinic. It will not be stored at home. NOTE: This sheet is a summary. It may not cover all possible information. If you have questions about this medicine, talk to your doctor, pharmacist, or health care provider.  2021 Elsevier/Gold Standard (2018-10-10 09:13:00)  

## 2020-06-17 ENCOUNTER — Telehealth: Payer: Self-pay | Admitting: Nurse Practitioner

## 2020-06-17 NOTE — Telephone Encounter (Signed)
I discussed right hip/pelvic x-ray with Cindy Frazier.  She is having minimal discomfort.  She is comfortable monitoring for now.  She will contact me in about a week or so with an update, sooner if she has increased pain.

## 2020-06-22 ENCOUNTER — Other Ambulatory Visit: Payer: Self-pay | Admitting: Nurse Practitioner

## 2020-06-22 ENCOUNTER — Telehealth: Payer: Self-pay

## 2020-06-22 DIAGNOSIS — C50911 Malignant neoplasm of unspecified site of right female breast: Secondary | ICD-10-CM

## 2020-06-22 NOTE — Telephone Encounter (Signed)
Called spoke to pt about getting CT scan, generally asked how pt was feeling pt reports she is able to stand bear wt ambulate some pain to area with long periods on feet otherwise no complaints  Prior auth inquiry sent once returned scan to be scheduled

## 2020-06-22 NOTE — Telephone Encounter (Signed)
-----   Message from Owens Shark, NP sent at 06/22/2020 10:32 AM EDT ----- Please let Cindy Frazier know Dr. Benay Spice recommends CT right hip, no contrast.  I have placed the order.

## 2020-06-28 ENCOUNTER — Other Ambulatory Visit: Payer: Self-pay

## 2020-06-28 ENCOUNTER — Ambulatory Visit (HOSPITAL_BASED_OUTPATIENT_CLINIC_OR_DEPARTMENT_OTHER)
Admission: RE | Admit: 2020-06-28 | Discharge: 2020-06-28 | Disposition: A | Discharge status: Home / Self Care | Payer: Medicare PPO | Source: Ambulatory Visit | Attending: Nurse Practitioner | Admitting: Nurse Practitioner

## 2020-06-28 DIAGNOSIS — M25551 Pain in right hip: Secondary | ICD-10-CM | POA: Diagnosis not present

## 2020-06-28 DIAGNOSIS — C7951 Secondary malignant neoplasm of bone: Secondary | ICD-10-CM | POA: Diagnosis not present

## 2020-06-28 DIAGNOSIS — C50911 Malignant neoplasm of unspecified site of right female breast: Secondary | ICD-10-CM | POA: Insufficient documentation

## 2020-07-05 ENCOUNTER — Telehealth: Payer: Self-pay

## 2020-07-05 NOTE — Telephone Encounter (Signed)
-----   Message from Ladell Pier, MD sent at 07/01/2020  9:26 PM EDT ----- Please call patient, CT negative for recurrent cancer, call for persistent pain and we will consider an MRI

## 2020-07-05 NOTE — Telephone Encounter (Signed)
Called message left concerning CT results encouraged pt to call for worsening sx or any changes

## 2020-08-27 ENCOUNTER — Other Ambulatory Visit: Payer: Self-pay | Admitting: Oncology

## 2020-08-27 DIAGNOSIS — C50919 Malignant neoplasm of unspecified site of unspecified female breast: Secondary | ICD-10-CM

## 2020-09-15 ENCOUNTER — Inpatient Hospital Stay: Payer: Medicare PPO

## 2020-09-15 ENCOUNTER — Inpatient Hospital Stay: Payer: Medicare PPO | Attending: Oncology

## 2020-09-15 ENCOUNTER — Ambulatory Visit: Payer: Medicare PPO | Admitting: Oncology

## 2020-09-15 ENCOUNTER — Other Ambulatory Visit: Payer: Self-pay

## 2020-09-15 VITALS — BP 144/69 | HR 68 | Temp 98.2°F | Resp 18 | Wt 142.6 lb

## 2020-09-15 DIAGNOSIS — C50911 Malignant neoplasm of unspecified site of right female breast: Secondary | ICD-10-CM

## 2020-09-15 DIAGNOSIS — C7951 Secondary malignant neoplasm of bone: Secondary | ICD-10-CM | POA: Insufficient documentation

## 2020-09-15 LAB — BASIC METABOLIC PANEL - CANCER CENTER ONLY
Anion gap: 11 (ref 5–15)
BUN: 23 mg/dL (ref 8–23)
CO2: 29 mmol/L (ref 22–32)
Calcium: 10.4 mg/dL — ABNORMAL HIGH (ref 8.9–10.3)
Chloride: 97 mmol/L — ABNORMAL LOW (ref 98–111)
Creatinine: 0.89 mg/dL (ref 0.44–1.00)
GFR, Estimated: >60 mL/min (ref >60)
Glucose, Bld: 185 mg/dL — ABNORMAL HIGH (ref 70–99)
Potassium: 3.7 mmol/L (ref 3.5–5.1)
Sodium: 137 mmol/L (ref 135–145)

## 2020-09-15 MED ORDER — SODIUM CHLORIDE 0.9 % IV SOLN: Freq: Once | INTRAVENOUS | Status: AC

## 2020-09-15 MED ORDER — ZOLEDRONIC ACID 4 MG/100ML IV SOLN
4.0000 mg | Freq: Once | INTRAVENOUS | Status: AC
  Administered 2020-09-15: 4 mg via INTRAVENOUS
  Filled 2020-09-15: qty 100

## 2020-09-15 NOTE — Patient Instructions (Signed)

## 2020-11-29 DIAGNOSIS — I700 Atherosclerosis of aorta: Secondary | ICD-10-CM | POA: Diagnosis not present

## 2020-11-29 DIAGNOSIS — C50919 Malignant neoplasm of unspecified site of unspecified female breast: Secondary | ICD-10-CM | POA: Diagnosis not present

## 2020-11-29 DIAGNOSIS — E1169 Type 2 diabetes mellitus with other specified complication: Secondary | ICD-10-CM | POA: Diagnosis not present

## 2020-11-29 DIAGNOSIS — C7951 Secondary malignant neoplasm of bone: Secondary | ICD-10-CM | POA: Diagnosis not present

## 2020-11-29 DIAGNOSIS — Z1389 Encounter for screening for other disorder: Secondary | ICD-10-CM | POA: Diagnosis not present

## 2020-11-29 DIAGNOSIS — I10 Essential (primary) hypertension: Secondary | ICD-10-CM | POA: Diagnosis not present

## 2020-11-29 DIAGNOSIS — Z0000 Encounter for general adult medical examination without abnormal findings: Secondary | ICD-10-CM | POA: Diagnosis not present

## 2020-11-29 DIAGNOSIS — Z7984 Long term (current) use of oral hypoglycemic drugs: Secondary | ICD-10-CM | POA: Diagnosis not present

## 2020-11-29 DIAGNOSIS — E7800 Pure hypercholesterolemia, unspecified: Secondary | ICD-10-CM | POA: Diagnosis not present

## 2020-12-15 ENCOUNTER — Inpatient Hospital Stay: Payer: Medicare PPO

## 2020-12-15 ENCOUNTER — Inpatient Hospital Stay: Payer: Medicare PPO | Admitting: Oncology

## 2020-12-15 ENCOUNTER — Inpatient Hospital Stay: Payer: Medicare PPO | Attending: Oncology

## 2020-12-15 ENCOUNTER — Other Ambulatory Visit: Payer: Self-pay

## 2020-12-15 VITALS — BP 142/63 | HR 73 | Temp 97.8°F | Resp 18 | Ht 62.0 in | Wt 137.2 lb

## 2020-12-15 VITALS — BP 118/60 | HR 67 | Temp 98.0°F | Resp 18

## 2020-12-15 DIAGNOSIS — C7951 Secondary malignant neoplasm of bone: Secondary | ICD-10-CM | POA: Insufficient documentation

## 2020-12-15 DIAGNOSIS — C50911 Malignant neoplasm of unspecified site of right female breast: Secondary | ICD-10-CM

## 2020-12-15 LAB — BASIC METABOLIC PANEL - CANCER CENTER ONLY
Anion gap: 10 (ref 5–15)
BUN: 24 mg/dL — ABNORMAL HIGH (ref 8–23)
CO2: 31 mmol/L (ref 22–32)
Calcium: 11.7 mg/dL — ABNORMAL HIGH (ref 8.9–10.3)
Chloride: 97 mmol/L — ABNORMAL LOW (ref 98–111)
Creatinine: 0.93 mg/dL (ref 0.44–1.00)
GFR, Estimated: >60 mL/min (ref >60)
Glucose, Bld: 126 mg/dL — ABNORMAL HIGH (ref 70–99)
Potassium: 3.9 mmol/L (ref 3.5–5.1)
Sodium: 138 mmol/L (ref 135–145)

## 2020-12-15 MED ORDER — ZOLEDRONIC ACID 4 MG/100ML IV SOLN
4.0000 mg | Freq: Once | INTRAVENOUS | Status: AC
  Administered 2020-12-15: 4 mg via INTRAVENOUS
  Filled 2020-12-15: qty 100

## 2020-12-15 MED ORDER — SODIUM CHLORIDE 0.9 % IV SOLN: Freq: Once | INTRAVENOUS | Status: AC

## 2020-12-15 NOTE — Progress Notes (Signed)
[facility] OFFICE PROGRESS NOTE   Diagnosis: Breast cancer  INTERVAL HISTORY:   Cindy Frazier returns as scheduled.  She continues anastrozole.  She had an episode of acute pain in the right hip in May.  A plain x-ray of the right hip on 06/15/2020 revealed evidence of a nondisplaced fracture of the medial right acetabulum.  A CT 06/28/2020 revealed radiation changes at the right acetabulum with no finding of recurrent tumor or pathologic fracture.  No concerning lytic or sclerotic bone lesions are identified. She continues to have intermittent right greater than left hip discomfort with certain movements.  She takes ibuprofen as needed. Objective:  Vital signs in last 24 hours:  Blood pressure (!) 142/63, pulse 73, temperature 97.8 F (36.6 C), temperature source Oral, resp. rate 18, height _0  (1.575 m), weight 137 lb 3.2 oz (62.2 kg), SpO2 100 %.    Lymphatics: No cervical, supraclavicular, or axillary nodes Resp: Lungs clear bilaterally Cardio: Regular rate and rhythm GI: No hepatosplenomegaly Vascular: No leg edema Breast: Status post right mastectomy with TRAM reconstruction.  No evidence for chest wall tumor recurrence Musculoskeletal: Mild pain with external rotation of the right hip.   Lab Results:  Lab Results  Component Value Date   WBC 7.2 02/21/2013   HGB 13.8 02/21/2013   HCT 43.9 02/21/2013   MCV 83.5 02/21/2013   PLT 291 02/21/2013   NEUTROABS 4.2 02/21/2013    CMP  Lab Results  Component Value Date   NA 138 12/15/2020   K 3.9 12/15/2020   CL 97 (L) 12/15/2020   CO2 31 12/15/2020   GLUCOSE 126 (H) 12/15/2020   BUN 24 (H) 12/15/2020   CREATININE 0.93 12/15/2020   CALCIUM 11.7 (H) 12/15/2020   PROT 7.6 12/31/2015   ALBUMIN 4.0 12/31/2015   AST 14 12/31/2015   ALT 22 12/31/2015   ALKPHOS 57 12/31/2015   BILITOT 0.37 12/31/2015   GFRNONAA >60 12/15/2020   GFRAA >60 09/16/2019    Medications: I have reviewed the patient's current  medications.   Assessment/Plan: Breast cancer-stage IIA (T2 N0) right breast cancer diagnosed in January 2000, status post a right mastectomy and TRAM reconstruction. She completed adjuvant chemotherapy followed by 5 years of tamoxifen and then 5 years of Femara. She completed Femara at the end of July 2010.   Right groin pain with an MRI and bone scan February 2015 concerning for metastatic disease involving the right acetabulum/ileum. Cycle 1 Zometa 03/21/2013   Status post SBRT to the right acetabulum completed 03/26/2013   Initiation of Arimidex 03/27/2013. Bone scan 09/23/2013. Metastatic disease in the right hemipelvis. Overall degree of activity decreased when compared to the prior exam. Bone scan 04/21/2014. Right pelvic/acetabular uptake slightly decreased since the prior exam. Bone scan 10/02/2014-unchanged metastasis at the right acetabulum, no new metastases, activity at the right greater than left mandible felt to reflect dental disease Bone scan 06/18/2015-no change in the right acetabulum metastasis. No new metastases.   Bone scan 04/22/2016-no change in right acetabulum metastasis, no new metastases CTs chest, abdomen, and pelvis on 12/28/2017- lytic/sclerotic lesion of the right acetabulum, no other bone lesions, no other evidence of metastatic disease in the chest, abdomen, and pelvis, multiple thyroid nodules Bone scan 12/17/2018-new focal area of increased activity at the mid right cervical spine-felt to most likely be degenerative after review with radiology, unchanged activity at the right acetabulum CT right hip 06/28/2020-radiation changes involving the right acetabulum with lytic and sclerotic pattern, no evidence  of recurrent tumor or pathologic fracture Status post CT guided right superior acetabular bone biopsy on 02/28/2013. Pathology showed metastatic carcinoma consistent with breast primary; ER positive (90-100%); PR weakly positive (5-10%); HER-2/neu not amplified;  Ki-67 20%. CT chest/abdomen/pelvis 02/28/2013 with mild thyroid enlargement and multiple low-attenuation lesions probably due to multinodular goiter; suggestion of asymmetry in the deep lateral central left breast; cholelithiasis; small left renal cysts; degenerative changes throughout the spine; no lymphadenopathy in the abdomen or pelvis; no concerning solid organ lesions; mixed lytic and sclerotic destructive lesion of the right acetabulum. History of Elevated calcium level-likely related to taking a potassium supplement prior to the blood draws Thyroid ultrasound 01/15/2018 - compared to June 2015-stable thyromegaly, bilateral nodules do not meet criteria for biopsy     Disposition: Cindy Frazier appears stable.  She is in clinical remission from breast cancer.  She will call for increased or consistent pain at the right hip and we will schedule a hip MRI.  We we will also obtain a restaging bone scan for increased pain.  Cindy Frazier will receive Zometa today.  She will return for an office visit and Zometa in 3 months.  The calcium level was elevated today.  She took calcium prior to arriving at the cancer center today.  She will return for repeat chemistry panel in 2 weeks.    Betsy Coder, MD  12/15/2020  11:44 AM

## 2020-12-15 NOTE — Patient Instructions (Signed)

## 2020-12-16 ENCOUNTER — Other Ambulatory Visit: Payer: Self-pay | Admitting: Oncology

## 2020-12-16 DIAGNOSIS — Z1231 Encounter for screening mammogram for malignant neoplasm of breast: Secondary | ICD-10-CM

## 2020-12-29 ENCOUNTER — Inpatient Hospital Stay: Payer: Medicare PPO

## 2020-12-29 ENCOUNTER — Other Ambulatory Visit: Payer: Self-pay | Admitting: Oncology

## 2020-12-29 ENCOUNTER — Other Ambulatory Visit: Payer: Self-pay

## 2020-12-29 DIAGNOSIS — C50911 Malignant neoplasm of unspecified site of right female breast: Secondary | ICD-10-CM

## 2020-12-29 DIAGNOSIS — C7951 Secondary malignant neoplasm of bone: Secondary | ICD-10-CM | POA: Diagnosis not present

## 2020-12-29 LAB — BASIC METABOLIC PANEL - CANCER CENTER ONLY
Anion gap: 7 (ref 5–15)
BUN: 21 mg/dL (ref 8–23)
CO2: 35 mmol/L — ABNORMAL HIGH (ref 22–32)
Calcium: 11.6 mg/dL — ABNORMAL HIGH (ref 8.9–10.3)
Chloride: 98 mmol/L (ref 98–111)
Creatinine: 1.03 mg/dL — ABNORMAL HIGH (ref 0.44–1.00)
GFR, Estimated: 58 mL/min — ABNORMAL LOW (ref >60)
Glucose, Bld: 130 mg/dL — ABNORMAL HIGH (ref 70–99)
Potassium: 3.6 mmol/L (ref 3.5–5.1)
Sodium: 140 mmol/L (ref 135–145)

## 2020-12-30 ENCOUNTER — Telehealth: Payer: Self-pay

## 2020-12-30 ENCOUNTER — Other Ambulatory Visit: Payer: Self-pay

## 2020-12-30 NOTE — Telephone Encounter (Signed)
Pt informed phlebotomist that she forgot to inform Dr Benay Spice that she has been taking tums for heart burn. Discussed with Dr Benay Spice stated Pt should not take tums for a couple days a return for labs. Lab appointment scheduled for Tuesday 01/04/21. Appointment confirmed

## 2021-01-04 ENCOUNTER — Other Ambulatory Visit: Payer: Self-pay

## 2021-01-04 ENCOUNTER — Inpatient Hospital Stay: Payer: Medicare PPO

## 2021-01-04 DIAGNOSIS — C7951 Secondary malignant neoplasm of bone: Secondary | ICD-10-CM

## 2021-01-04 DIAGNOSIS — C50911 Malignant neoplasm of unspecified site of right female breast: Secondary | ICD-10-CM

## 2021-01-04 LAB — CMP (CANCER CENTER ONLY)
ALT: 14 U/L (ref 0–44)
AST: 12 U/L — ABNORMAL LOW (ref 15–41)
Albumin: 4.1 g/dL (ref 3.5–5.0)
Alkaline Phosphatase: 44 U/L (ref 38–126)
Anion gap: 12 (ref 5–15)
BUN: 27 mg/dL — ABNORMAL HIGH (ref 8–23)
CO2: 27 mmol/L (ref 22–32)
Calcium: 9.7 mg/dL (ref 8.9–10.3)
Chloride: 99 mmol/L (ref 98–111)
Creatinine: 1.02 mg/dL — ABNORMAL HIGH (ref 0.44–1.00)
GFR, Estimated: 59 mL/min — ABNORMAL LOW (ref >60)
Glucose, Bld: 128 mg/dL — ABNORMAL HIGH (ref 70–99)
Potassium: 3.3 mmol/L — ABNORMAL LOW (ref 3.5–5.1)
Sodium: 138 mmol/L (ref 135–145)
Total Bilirubin: 0.5 mg/dL (ref 0.3–1.2)
Total Protein: 7.1 g/dL (ref 6.5–8.1)

## 2021-01-05 ENCOUNTER — Telehealth: Payer: Self-pay

## 2021-01-05 LAB — PTH, INTACT AND CALCIUM
Calcium, Total (PTH): 9.6 mg/dL (ref 8.7–10.3)
PTH: 26 pg/mL (ref 15–65)

## 2021-01-05 NOTE — Telephone Encounter (Addendum)
Pt verbalized understanding inquired about 3 month appointment. Confirmed date 03/16/21 @ 1045 for lab,Dr. Benay Spice , and infusion. Labs faxed to Dr Laurann Montana.

## 2021-01-05 NOTE — Telephone Encounter (Signed)
-----   Message from Ladell Pier, MD sent at 01/04/2021  4:54 PM EST ----- Please call patient, calcium is normal today, follow-up as scheduled, potassium mildly low-likely related to HCTZ therapy, should have a repeat potassium with primary provider next several months, copy chemistry panel to Dr. Kelton Pillar

## 2021-01-13 LAB — PTH-RELATED PEPTIDE: PTH-related peptide: <2 pmol/L

## 2021-02-03 ENCOUNTER — Ambulatory Visit
Admission: RE | Admit: 2021-02-03 | Discharge: 2021-02-03 | Disposition: A | Discharge status: Home / Self Care | Payer: Medicare PPO | Source: Ambulatory Visit | Attending: Oncology | Admitting: Oncology

## 2021-02-03 DIAGNOSIS — Z1231 Encounter for screening mammogram for malignant neoplasm of breast: Secondary | ICD-10-CM

## 2021-03-08 DIAGNOSIS — H40023 Open angle with borderline findings, high risk, bilateral: Secondary | ICD-10-CM | POA: Diagnosis not present

## 2021-03-08 DIAGNOSIS — H25813 Combined forms of age-related cataract, bilateral: Secondary | ICD-10-CM | POA: Diagnosis not present

## 2021-03-08 DIAGNOSIS — E119 Type 2 diabetes mellitus without complications: Secondary | ICD-10-CM | POA: Diagnosis not present

## 2021-03-16 ENCOUNTER — Inpatient Hospital Stay: Payer: Medicare PPO | Attending: Oncology

## 2021-03-16 ENCOUNTER — Telehealth: Payer: Self-pay

## 2021-03-16 ENCOUNTER — Encounter: Payer: Self-pay | Admitting: *Deleted

## 2021-03-16 ENCOUNTER — Other Ambulatory Visit: Payer: Self-pay

## 2021-03-16 ENCOUNTER — Inpatient Hospital Stay: Payer: Medicare PPO

## 2021-03-16 ENCOUNTER — Inpatient Hospital Stay: Payer: Medicare PPO | Admitting: Oncology

## 2021-03-16 VITALS — BP 137/71 | HR 75 | Temp 98.2°F | Resp 18 | Ht 62.0 in | Wt 139.8 lb

## 2021-03-16 DIAGNOSIS — R1031 Right lower quadrant pain: Secondary | ICD-10-CM | POA: Diagnosis not present

## 2021-03-16 DIAGNOSIS — C50911 Malignant neoplasm of unspecified site of right female breast: Secondary | ICD-10-CM | POA: Diagnosis not present

## 2021-03-16 DIAGNOSIS — C7951 Secondary malignant neoplasm of bone: Secondary | ICD-10-CM | POA: Diagnosis not present

## 2021-03-16 DIAGNOSIS — Z853 Personal history of malignant neoplasm of breast: Secondary | ICD-10-CM | POA: Insufficient documentation

## 2021-03-16 LAB — BASIC METABOLIC PANEL - CANCER CENTER ONLY
Anion gap: 11 (ref 5–15)
BUN: 24 mg/dL — ABNORMAL HIGH (ref 8–23)
CO2: 29 mmol/L (ref 22–32)
Calcium: 10.4 mg/dL — ABNORMAL HIGH (ref 8.9–10.3)
Chloride: 98 mmol/L (ref 98–111)
Creatinine: 0.96 mg/dL (ref 0.44–1.00)
GFR, Estimated: >60 mL/min (ref >60)
Glucose, Bld: 207 mg/dL — ABNORMAL HIGH (ref 70–99)
Potassium: 3.7 mmol/L (ref 3.5–5.1)
Sodium: 138 mmol/L (ref 135–145)

## 2021-03-16 NOTE — Telephone Encounter (Signed)
Patient presents today for Zometa. 3 unsuccessful attempts to start an IV. Patient declined anymore attempts stating that 3 was her limit, she requested to skip this month and return on her next scheduled appointment in June.  ?

## 2021-03-16 NOTE — Progress Notes (Signed)
Patient seen by Dr. Benay Spice today ? ?Vitals are within treatment parameters. ? ?Labs reviewed by Dr. Benay Spice and are not all within treatment parameters. Calicium mildly elevated--OK to treat ? ?Per physician team, patient is ready for treatment and there are NO modifications to the treatment plan.  ?

## 2021-03-16 NOTE — Progress Notes (Signed)
?[facility] ?OFFICE PROGRESS NOTE ? ? ?Diagnosis: Breast cancer ? ?INTERVAL HISTORY:  ? ?Cindy Frazier returns as scheduled.  She continues anastrozole.  No consistent pain.  She has an occasional "twinge "of discomfort at the left hip/groin.  This occurs with certain position changes. ?A left mammogram 02/03/2021 was negative. ?Objective: ? ?Vital signs in last 24 hours: ? ?Blood pressure 137/71, pulse 75, temperature 98.2 ?F (36.8 ?C), temperature source Oral, resp. rate 18, height _0  (1.575 m), weight 139 lb 12.8 oz (63.4 kg), SpO2 98 %. ?  ? ?Lymphatics: No cervical, supraclavicular, axillary, or inguinal nodes ?Resp: Lungs clear bilateral ?Cardio: Regular rate and rhythm ?GI: No hepatosplenomegaly ?Vascular: No leg edema ?Musculoskeletal: No pain with motion of the hips ?Breast: Right mastectomy with TRAM reconstruction.  No evidence for chest wall tumor recurrence.  Left breast without mass. ?  ? ?Portacath/PICC-without erythema ? ?Lab Results: ? ?Lab Results  ?Component Value Date  ? WBC 7.2 02/21/2013  ? HGB 13.8 02/21/2013  ? HCT 43.9 02/21/2013  ? MCV 83.5 02/21/2013  ? PLT 291 02/21/2013  ? NEUTROABS 4.2 02/21/2013  ? ? ?CMP  ?Lab Results  ?Component Value Date  ? NA 138 03/16/2021  ? K 3.7 03/16/2021  ? CL 98 03/16/2021  ? CO2 29 03/16/2021  ? GLUCOSE 207 (H) 03/16/2021  ? BUN 24 (H) 03/16/2021  ? CREATININE 0.96 03/16/2021  ? CALCIUM 10.4 (H) 03/16/2021  ? PROT 7.1 01/04/2021  ? ALBUMIN 4.1 01/04/2021  ? AST 12 (L) 01/04/2021  ? ALT 14 01/04/2021  ? ALKPHOS 44 01/04/2021  ? BILITOT 0.5 01/04/2021  ? GFRNONAA >60 03/16/2021  ? GFRAA >60 09/16/2019  ? ? ?No results found for: CEA1, CEA, [id number], CA125 ? ?No results found for: INR, LABPROT ? ?Imaging: ? ?No results found. ? ?Medications: I have reviewed the patient's current medications. ? ? ?Assessment/Plan: ?Breast cancer-stage IIA (T2 N0) right breast cancer diagnosed in January 2000, status post a right mastectomy and TRAM  reconstruction. She completed adjuvant chemotherapy followed by 5 years of tamoxifen and then 5 years of Femara. She completed Femara at the end of July 2010.   ?Right groin pain with an MRI and bone scan February 2015 concerning for metastatic disease involving the right acetabulum/ileum. ?Cycle 1 Zometa 03/21/2013   ?Status post SBRT to the right acetabulum completed 03/26/2013   ?Initiation of Arimidex 03/27/2013. ?Bone scan 09/23/2013. Metastatic disease in the right hemipelvis. Overall degree of activity decreased when compared to the prior exam. ?Bone scan 04/21/2014. Right pelvic/acetabular uptake slightly decreased since the prior exam. ?Bone scan 10/02/2014-unchanged metastasis at the right acetabulum, no new metastases, activity at the right greater than left mandible felt to reflect dental disease ?Bone scan 06/18/2015-no change in the right acetabulum metastasis. No new metastases.  ? Bone scan 04/22/2016-no change in right acetabulum metastasis, no new metastases ?CTs chest, abdomen, and pelvis on 12/28/2017- lytic/sclerotic lesion of the right acetabulum, no other bone lesions, no other evidence of metastatic disease in the chest, abdomen, and pelvis, multiple thyroid nodules ?Bone scan 12/17/2018-new focal area of increased activity at the mid right cervical spine-felt to most likely be degenerative after review with radiology, unchanged activity at the right acetabulum ?CT right hip 06/28/2020-radiation changes involving the right acetabulum with lytic and sclerotic pattern, no evidence of recurrent tumor or pathologic fracture ?Status post CT guided right superior acetabular bone biopsy on 02/28/2013. Pathology showed metastatic carcinoma consistent with breast primary; ER positive (90-100%); PR  weakly positive (5-10%); HER-2/neu not amplified; Ki-67 20%. ?CT chest/abdomen/pelvis 02/28/2013 with mild thyroid enlargement and multiple low-attenuation lesions probably due to multinodular goiter;  suggestion of asymmetry in the deep lateral central left breast; cholelithiasis; small left renal cysts; degenerative changes throughout the spine; no lymphadenopathy in the abdomen or pelvis; no concerning solid organ lesions; mixed lytic and sclerotic destructive lesion of the right acetabulum. ?History of Elevated calcium level-likely related to taking a potassium supplement prior to the blood draws ?Thyroid ultrasound 01/15/2018 - compared to June 2015-stable thyromegaly, bilateral nodules do not meet criteria for biopsy ?  ? ? ? ? ?Disposition: ?Cindy Frazier appears stable.  She will continue anastrozole.  No clinical evidence of disease progression.  She will receive Zometa today.  She will return for Zometa and a restaging bone scan in 3 months.  She will be scheduled for an office visit in 6 months.  She will call if she develops consistent pain. ?She has chronic mild elevation of the calcium level, potentially related to HCTZ therapy. ?Betsy Coder, MD ? ?03/16/2021  ?11:51 AM ? ? ?

## 2021-05-10 ENCOUNTER — Telehealth: Payer: Self-pay | Admitting: Oncology

## 2021-05-10 NOTE — Telephone Encounter (Signed)
Attempted to contact patient after listening to voicemail in regards to bone scan, we advised that [facility] should be giving her a call soon if she does not hear from there by mid May to please give [place] a call back. No answer so voicemail was left with this information. ?

## 2021-05-20 DIAGNOSIS — H25811 Combined forms of age-related cataract, right eye: Secondary | ICD-10-CM | POA: Diagnosis not present

## 2021-05-30 DIAGNOSIS — I700 Atherosclerosis of aorta: Secondary | ICD-10-CM | POA: Diagnosis not present

## 2021-05-30 DIAGNOSIS — E7800 Pure hypercholesterolemia, unspecified: Secondary | ICD-10-CM | POA: Diagnosis not present

## 2021-05-30 DIAGNOSIS — C50911 Malignant neoplasm of unspecified site of right female breast: Secondary | ICD-10-CM | POA: Diagnosis not present

## 2021-05-30 DIAGNOSIS — I10 Essential (primary) hypertension: Secondary | ICD-10-CM | POA: Diagnosis not present

## 2021-05-30 DIAGNOSIS — E1169 Type 2 diabetes mellitus with other specified complication: Secondary | ICD-10-CM | POA: Diagnosis not present

## 2021-06-16 ENCOUNTER — Inpatient Hospital Stay: Payer: Medicare PPO

## 2021-06-16 ENCOUNTER — Encounter (HOSPITAL_COMMUNITY)
Admission: RE | Admit: 2021-06-16 | Discharge: 2021-06-16 | Disposition: A | Discharge status: Home / Self Care | Payer: Medicare PPO | Source: Ambulatory Visit | Attending: Oncology | Admitting: Oncology

## 2021-06-16 ENCOUNTER — Other Ambulatory Visit: Payer: Self-pay

## 2021-06-16 ENCOUNTER — Ambulatory Visit (HOSPITAL_COMMUNITY)
Admission: RE | Admit: 2021-06-16 | Discharge: 2021-06-16 | Disposition: A | Discharge status: Home / Self Care | Payer: Medicare PPO | Source: Ambulatory Visit | Attending: Oncology | Admitting: Oncology

## 2021-06-16 ENCOUNTER — Inpatient Hospital Stay: Payer: Medicare PPO | Attending: Oncology

## 2021-06-16 VITALS — BP 157/61 | HR 71 | Temp 98.5°F | Resp 18 | Wt 141.5 lb

## 2021-06-16 DIAGNOSIS — C50911 Malignant neoplasm of unspecified site of right female breast: Secondary | ICD-10-CM

## 2021-06-16 DIAGNOSIS — C7951 Secondary malignant neoplasm of bone: Secondary | ICD-10-CM | POA: Diagnosis not present

## 2021-06-16 DIAGNOSIS — C50919 Malignant neoplasm of unspecified site of unspecified female breast: Secondary | ICD-10-CM | POA: Diagnosis not present

## 2021-06-16 LAB — BASIC METABOLIC PANEL - CANCER CENTER ONLY
Anion gap: 8 (ref 5–15)
BUN: 18 mg/dL (ref 8–23)
CO2: 29 mmol/L (ref 22–32)
Calcium: 10.2 mg/dL (ref 8.9–10.3)
Chloride: 101 mmol/L (ref 98–111)
Creatinine: 0.89 mg/dL (ref 0.44–1.00)
GFR, Estimated: >60 mL/min (ref >60)
Glucose, Bld: 214 mg/dL — ABNORMAL HIGH (ref 70–99)
Potassium: 3.8 mmol/L (ref 3.5–5.1)
Sodium: 138 mmol/L (ref 135–145)

## 2021-06-16 MED ORDER — TECHNETIUM TC 99M MEDRONATE IV KIT
20.0000 | PACK | Freq: Once | INTRAVENOUS | Status: AC | PRN
  Administered 2021-06-16: 21 via INTRAVENOUS

## 2021-06-16 MED ORDER — SODIUM CHLORIDE 0.9 % IV SOLN: Freq: Once | INTRAVENOUS | Status: AC

## 2021-06-16 MED ORDER — ZOLEDRONIC ACID 4 MG/100ML IV SOLN
4.0000 mg | Freq: Once | INTRAVENOUS | Status: AC
  Administered 2021-06-16: 4 mg via INTRAVENOUS
  Filled 2021-06-16: qty 100

## 2021-06-16 NOTE — Progress Notes (Signed)
This RN left Pt's PIV in per Pt request. Pt has a scheduled bone scan today after infusion appt.

## 2021-06-16 NOTE — Patient Instructions (Signed)

## 2021-06-17 ENCOUNTER — Telehealth: Payer: Self-pay

## 2021-06-17 NOTE — Telephone Encounter (Signed)
-----   Message from Ladell Pier, MD sent at 06/16/2021  8:22 PM EDT ----- Please call patient, calcium is normal, follow-up as scheduled

## 2021-06-17 NOTE — Telephone Encounter (Signed)
Patient gave verbal understanding ?

## 2021-08-17 DIAGNOSIS — H2512 Age-related nuclear cataract, left eye: Secondary | ICD-10-CM | POA: Diagnosis not present

## 2021-08-23 DIAGNOSIS — H25812 Combined forms of age-related cataract, left eye: Secondary | ICD-10-CM | POA: Diagnosis not present

## 2021-09-14 ENCOUNTER — Inpatient Hospital Stay: Payer: Medicare PPO | Attending: Oncology

## 2021-09-14 ENCOUNTER — Inpatient Hospital Stay: Payer: Medicare PPO | Admitting: Oncology

## 2021-09-14 ENCOUNTER — Inpatient Hospital Stay: Payer: Medicare PPO

## 2021-09-14 ENCOUNTER — Encounter: Payer: Self-pay | Admitting: *Deleted

## 2021-09-14 VITALS — BP 142/78 | HR 74 | Temp 98.5°F | Resp 16 | Ht 62.0 in | Wt 140.0 lb

## 2021-09-14 DIAGNOSIS — C7951 Secondary malignant neoplasm of bone: Secondary | ICD-10-CM | POA: Diagnosis not present

## 2021-09-14 DIAGNOSIS — Z170 Estrogen receptor positive status [ER+]: Secondary | ICD-10-CM | POA: Insufficient documentation

## 2021-09-14 DIAGNOSIS — C50911 Malignant neoplasm of unspecified site of right female breast: Secondary | ICD-10-CM | POA: Insufficient documentation

## 2021-09-14 LAB — BASIC METABOLIC PANEL - CANCER CENTER ONLY
Anion gap: 11 (ref 5–15)
BUN: 22 mg/dL (ref 8–23)
CO2: 26 mmol/L (ref 22–32)
Calcium: 10.1 mg/dL (ref 8.9–10.3)
Chloride: 101 mmol/L (ref 98–111)
Creatinine: 0.89 mg/dL (ref 0.44–1.00)
GFR, Estimated: >60 mL/min (ref >60)
Glucose, Bld: 212 mg/dL — ABNORMAL HIGH (ref 70–99)
Potassium: 3.9 mmol/L (ref 3.5–5.1)
Sodium: 138 mmol/L (ref 135–145)

## 2021-09-14 MED ORDER — SODIUM CHLORIDE 0.9 % IV SOLN: Freq: Once | INTRAVENOUS | Status: AC

## 2021-09-14 MED ORDER — ZOLEDRONIC ACID 4 MG/100ML IV SOLN
4.0000 mg | Freq: Once | INTRAVENOUS | Status: AC
  Administered 2021-09-14: 4 mg via INTRAVENOUS
  Filled 2021-09-14: qty 100

## 2021-09-14 NOTE — Patient Instructions (Signed)

## 2021-09-14 NOTE — Progress Notes (Signed)
Patient seen by Dr. Sherrill today ? ?Vitals are within treatment parameters. ? ?Labs reviewed by Dr. Sherrill and are within treatment parameters. ? ?Per physician team, patient is ready for treatment and there are NO modifications to the treatment plan.  ?

## 2021-09-14 NOTE — Progress Notes (Signed)
[facility] OFFICE PROGRESS NOTE   Diagnosis: Breast cancer  INTERVAL HISTORY:   Cindy Frazier returns as scheduled.  She continues anastrozole.  No new complaint.  No tooth or jaw pain.  She has intermittent discomfort at the bilateral hip with certain movements.  No consistent pain.  She last received Zometa 06/16/2021.  She continues to have difficulty with IV access.  Objective:  Vital signs in last 24 hours:  Blood pressure (!) 142/78, pulse 74, temperature 98.5 F (36.9 C), temperature source Oral, resp. rate 16, height 5' 2"  (1.575 m), weight 140 lb (63.5 kg), SpO2 100 %.    Lymphatics: No cervical, supraclavicular, or axillary nodes Resp: Lungs clear bilaterally Cardio: Regular rate and rhythm GI: No hepatosplenomegaly Vascular: No leg edema Musculoskeletal: No pain with motion at the right or left hip.  No tenderness at the bilateral trochanter Breast: Right mastectomy with a TRAM reconstruction.  No evidence for chest wall tumor recurrence.  Left breast without mass.   Lab Results:    CMP  Lab Results  Component Value Date   NA 138 09/14/2021   K 3.9 09/14/2021   CL 101 09/14/2021   CO2 26 09/14/2021   GLUCOSE 212 (H) 09/14/2021   BUN 22 09/14/2021   CREATININE 0.89 09/14/2021   CALCIUM 10.1 09/14/2021   PROT 7.1 01/04/2021   ALBUMIN 4.1 01/04/2021   AST 12 (L) 01/04/2021   ALT 14 01/04/2021   ALKPHOS 44 01/04/2021   BILITOT 0.5 01/04/2021   GFRNONAA >60 09/14/2021   GFRAA >60 09/16/2019    Imaging: Bone scan images from 06/16/2021 reviewed with Cindy Frazier  No results found.  Medications: I have reviewed the patient's current medications.   Assessment/Plan: Breast cancer-stage IIA (T2 N0) right breast cancer diagnosed in January 2000, status post a right mastectomy and TRAM reconstruction. She completed adjuvant chemotherapy followed by 5 years of tamoxifen and then 5 years of Femara. She completed Femara at the end of July 2010.    Right groin pain with an MRI and bone scan February 2015 concerning for metastatic disease involving the right acetabulum/ileum. Cycle 1 Zometa 03/21/2013   Status post SBRT to the right acetabulum completed 03/26/2013   Initiation of Arimidex 03/27/2013. Bone scan 09/23/2013. Metastatic disease in the right hemipelvis. Overall degree of activity decreased when compared to the prior exam. Bone scan 04/21/2014. Right pelvic/acetabular uptake slightly decreased since the prior exam. Bone scan 10/02/2014-unchanged metastasis at the right acetabulum, no new metastases, activity at the right greater than left mandible felt to reflect dental disease Bone scan 06/18/2015-no change in the right acetabulum metastasis. No new metastases.   Bone scan 04/22/2016-no change in right acetabulum metastasis, no new metastases CTs chest, abdomen, and pelvis on 12/28/2017- lytic/sclerotic lesion of the right acetabulum, no other bone lesions, no other evidence of metastatic disease in the chest, abdomen, and pelvis, multiple thyroid nodules Bone scan 12/17/2018-new focal area of increased activity at the mid right cervical spine-felt to most likely be degenerative after review with radiology, unchanged activity at the right acetabulum CT right hip 06/28/2020-radiation changes involving the right acetabulum with lytic and sclerotic pattern, no evidence of recurrent tumor or pathologic fracture Bone scan 06/16/2021-slight increase in radiotracer at the left greater trochanter, stable uptake at the right acetabulum and superior pubic ramus, stable polyarticular degenerative changes, similar uptake in the mandible-periodontal disease? Status post CT guided right superior acetabular bone biopsy on 02/28/2013. Pathology showed metastatic carcinoma consistent with breast primary; ER  positive (90-100%); PR weakly positive (5-10%); HER-2/neu not amplified; Ki-67 20%. CT chest/abdomen/pelvis 02/28/2013 with mild thyroid enlargement  and multiple low-attenuation lesions probably due to multinodular goiter; suggestion of asymmetry in the deep lateral central left breast; cholelithiasis; small left renal cysts; degenerative changes throughout the spine; no lymphadenopathy in the abdomen or pelvis; no concerning solid organ lesions; mixed lytic and sclerotic destructive lesion of the right acetabulum. History of Elevated calcium level-likely related to taking a potassium supplement prior to the blood draws Thyroid ultrasound 01/15/2018 - compared to June 2015-stable thyromegaly, bilateral nodules do not meet criteria for biopsy        Disposition: Cindy Frazier appears unchanged.  There is no clinical evidence for progression of the metastatic breast cancer.  She continues anastrozole.  I reviewed the 06/16/2021 bone scan findings and images with her.  There are multiple areas of degenerative uptake.  The area of uptake at the left trochanter has been present in the past.  I have a low clinical suspicion for metastatic disease in the left femur.  She does not have consistent pain.  She continues every 3 months Zometa.  She is scheduled to see her dentist within the next few months.  I doubt the mandible uptake is related to osteonecrosis as she has no symptoms to suggest this.  Cindy Frazier will return for Zometa in 3 months.  She will be scheduled for an office visit in 6 months.  Betsy Coder, MD  09/14/2021  1:36 PM

## 2021-09-20 ENCOUNTER — Other Ambulatory Visit: Payer: Self-pay | Admitting: Oncology

## 2021-09-20 DIAGNOSIS — C50919 Malignant neoplasm of unspecified site of unspecified female breast: Secondary | ICD-10-CM

## 2021-11-15 ENCOUNTER — Other Ambulatory Visit: Payer: Self-pay | Admitting: Oncology

## 2021-11-15 DIAGNOSIS — Z1231 Encounter for screening mammogram for malignant neoplasm of breast: Secondary | ICD-10-CM

## 2021-12-06 DIAGNOSIS — E042 Nontoxic multinodular goiter: Secondary | ICD-10-CM | POA: Diagnosis not present

## 2021-12-06 DIAGNOSIS — E7800 Pure hypercholesterolemia, unspecified: Secondary | ICD-10-CM | POA: Diagnosis not present

## 2021-12-06 DIAGNOSIS — C50919 Malignant neoplasm of unspecified site of unspecified female breast: Secondary | ICD-10-CM | POA: Diagnosis not present

## 2021-12-06 DIAGNOSIS — I700 Atherosclerosis of aorta: Secondary | ICD-10-CM | POA: Diagnosis not present

## 2021-12-06 DIAGNOSIS — I10 Essential (primary) hypertension: Secondary | ICD-10-CM | POA: Diagnosis not present

## 2021-12-06 DIAGNOSIS — C7951 Secondary malignant neoplasm of bone: Secondary | ICD-10-CM | POA: Diagnosis not present

## 2021-12-06 DIAGNOSIS — Z0000 Encounter for general adult medical examination without abnormal findings: Secondary | ICD-10-CM | POA: Diagnosis not present

## 2021-12-06 DIAGNOSIS — E1169 Type 2 diabetes mellitus with other specified complication: Secondary | ICD-10-CM | POA: Diagnosis not present

## 2021-12-15 ENCOUNTER — Inpatient Hospital Stay: Payer: Medicare PPO

## 2021-12-15 ENCOUNTER — Inpatient Hospital Stay: Payer: Medicare PPO | Attending: Oncology

## 2021-12-15 VITALS — BP 130/63 | HR 66 | Temp 98.2°F | Resp 18 | Ht 62.0 in | Wt 140.0 lb

## 2021-12-15 DIAGNOSIS — Z79811 Long term (current) use of aromatase inhibitors: Secondary | ICD-10-CM | POA: Diagnosis not present

## 2021-12-15 DIAGNOSIS — C7951 Secondary malignant neoplasm of bone: Secondary | ICD-10-CM | POA: Insufficient documentation

## 2021-12-15 DIAGNOSIS — C50911 Malignant neoplasm of unspecified site of right female breast: Secondary | ICD-10-CM

## 2021-12-15 DIAGNOSIS — Z170 Estrogen receptor positive status [ER+]: Secondary | ICD-10-CM | POA: Diagnosis not present

## 2021-12-15 LAB — BASIC METABOLIC PANEL - CANCER CENTER ONLY
Anion gap: 11 (ref 5–15)
BUN: 19 mg/dL (ref 8–23)
CO2: 24 mmol/L (ref 22–32)
Calcium: 9.9 mg/dL (ref 8.9–10.3)
Chloride: 101 mmol/L (ref 98–111)
Creatinine: 0.9 mg/dL (ref 0.44–1.00)
GFR, Estimated: >60 mL/min (ref >60)
Glucose, Bld: 174 mg/dL — ABNORMAL HIGH (ref 70–99)
Potassium: 4.1 mmol/L (ref 3.5–5.1)
Sodium: 136 mmol/L (ref 135–145)

## 2021-12-15 MED ORDER — ZOLEDRONIC ACID 4 MG/100ML IV SOLN
4.0000 mg | Freq: Once | INTRAVENOUS | Status: AC
  Administered 2021-12-15: 4 mg via INTRAVENOUS
  Filled 2021-12-15: qty 100

## 2021-12-15 MED ORDER — SODIUM CHLORIDE 0.9 % IV SOLN: INTRAVENOUS | Status: DC

## 2021-12-15 NOTE — Patient Instructions (Signed)

## 2021-12-19 DIAGNOSIS — D492 Neoplasm of unspecified behavior of bone, soft tissue, and skin: Secondary | ICD-10-CM | POA: Diagnosis not present

## 2021-12-19 DIAGNOSIS — H40023 Open angle with borderline findings, high risk, bilateral: Secondary | ICD-10-CM | POA: Diagnosis not present

## 2022-02-06 ENCOUNTER — Ambulatory Visit
Admission: RE | Admit: 2022-02-06 | Discharge: 2022-02-06 | Disposition: A | Discharge status: Home / Self Care | Payer: Medicare PPO | Source: Ambulatory Visit | Attending: Oncology | Admitting: Oncology

## 2022-02-06 DIAGNOSIS — Z1231 Encounter for screening mammogram for malignant neoplasm of breast: Secondary | ICD-10-CM

## 2022-03-16 ENCOUNTER — Inpatient Hospital Stay: Payer: Medicare PPO

## 2022-03-16 ENCOUNTER — Inpatient Hospital Stay: Payer: Medicare PPO | Attending: Oncology | Admitting: Oncology

## 2022-03-16 VITALS — BP 138/65 | HR 78 | Temp 98.2°F | Resp 20 | Ht 62.0 in | Wt 137.0 lb

## 2022-03-16 DIAGNOSIS — Z170 Estrogen receptor positive status [ER+]: Secondary | ICD-10-CM | POA: Diagnosis not present

## 2022-03-16 DIAGNOSIS — C50911 Malignant neoplasm of unspecified site of right female breast: Secondary | ICD-10-CM

## 2022-03-16 DIAGNOSIS — C7951 Secondary malignant neoplasm of bone: Secondary | ICD-10-CM | POA: Diagnosis not present

## 2022-03-16 LAB — BASIC METABOLIC PANEL - CANCER CENTER ONLY
Anion gap: 10 (ref 5–15)
BUN: 23 mg/dL (ref 8–23)
CO2: 26 mmol/L (ref 22–32)
Calcium: 10.1 mg/dL (ref 8.9–10.3)
Chloride: 100 mmol/L (ref 98–111)
Creatinine: 0.92 mg/dL (ref 0.44–1.00)
GFR, Estimated: >60 mL/min (ref >60)
Glucose, Bld: 183 mg/dL — ABNORMAL HIGH (ref 70–99)
Potassium: 4.2 mmol/L (ref 3.5–5.1)
Sodium: 136 mmol/L (ref 135–145)

## 2022-03-16 MED ORDER — SODIUM CHLORIDE 0.9 % IV SOLN: INTRAVENOUS | Status: DC

## 2022-03-16 MED ORDER — ZOLEDRONIC ACID 4 MG/100ML IV SOLN
4.0000 mg | Freq: Once | INTRAVENOUS | Status: AC
  Administered 2022-03-16: 4 mg via INTRAVENOUS
  Filled 2022-03-16: qty 100

## 2022-03-16 NOTE — Patient Instructions (Signed)

## 2022-03-16 NOTE — Progress Notes (Signed)
Cindy Frazier   Diagnosis: Breast cancer  INTERVAL HISTORY:   Cindy Frazier returns as scheduled.  She feels well.  A left mammogram 02/06/2022 was negative.  She has intermittent discomfort at the right shoulder.  No new site of pain or consistent pain.  Good appetite.  Her ophthalmologist found a "red "spot at the left inner eyelid.  She has been referred to the [facility]. No tooth or jaw pain.  Objective:  Vital signs in last 24 hours:  Blood pressure 138/65, pulse 78, temperature 98.2 F (36.8 C), temperature source Oral, resp. rate 20, height '5\' 2"'$  (1.575 m), weight 137 lb (62.1 kg), SpO2 98 %.    HEENT: 2-3 mm erythematous lesion at the inner left eyelid Lymphatics: No cervical, supraclavicular, or axillary nodes Resp: Lungs clear bilaterally Cardio: Regular rate and rhythm GI: No hepatosplenomegaly, nontender Vascular: No leg edema Musculoskeletal: No pain with motion at the right hip.  No tenderness at the right trochanter  Lab Results:  Lab Results  Component Value Date   WBC 7.2 02/21/2013   HGB 13.8 02/21/2013   HCT 43.9 02/21/2013   MCV 83.5 02/21/2013   PLT 291 02/21/2013   NEUTROABS 4.2 02/21/2013    CMP  Lab Results  Component Value Date   NA 136 03/16/2022   K 4.2 03/16/2022   CL 100 03/16/2022   CO2 26 03/16/2022   GLUCOSE 183 (H) 03/16/2022   BUN 23 03/16/2022   CREATININE 0.92 03/16/2022   CALCIUM 10.1 03/16/2022   PROT 7.1 01/04/2021   ALBUMIN 4.1 01/04/2021   AST 12 (L) 01/04/2021   ALT 14 01/04/2021   ALKPHOS 44 01/04/2021   BILITOT 0.5 01/04/2021   GFRNONAA >60 03/16/2022   GFRAA >60 09/16/2019     Medications: I have reviewed the patient's current medications.   Assessment/Plan: Breast cancer-stage IIA (T2 N0) right breast cancer diagnosed in January 2000, status post a right mastectomy and TRAM reconstruction. She completed adjuvant chemotherapy followed by 5 years of tamoxifen and then 5  years of Femara. She completed Femara at the end of July 2010.   Right groin pain with an MRI and bone scan February 2015 concerning for metastatic disease involving the right acetabulum/ileum. Cycle 1 Zometa 03/21/2013   Status post SBRT to the right acetabulum completed 03/26/2013   Initiation of Arimidex 03/27/2013. Bone scan 09/23/2013. Metastatic disease in the right hemipelvis. Overall degree of activity decreased when compared to the prior exam. Bone scan 04/21/2014. Right pelvic/acetabular uptake slightly decreased since the prior exam. Bone scan 10/02/2014-unchanged metastasis at the right acetabulum, no new metastases, activity at the right greater than left mandible felt to reflect dental disease Bone scan 06/18/2015-no change in the right acetabulum metastasis. No new metastases.   Bone scan 04/22/2016-no change in right acetabulum metastasis, no new metastases CTs chest, abdomen, and pelvis on 12/28/2017- lytic/sclerotic lesion of the right acetabulum, no other bone lesions, no other evidence of metastatic disease in the chest, abdomen, and pelvis, multiple thyroid nodules Bone scan 12/17/2018-new focal area of increased activity at the mid right cervical spine-felt to most likely be degenerative after review with radiology, unchanged activity at the right acetabulum CT right hip 06/28/2020-radiation changes involving the right acetabulum with lytic and sclerotic pattern, no evidence of recurrent tumor or pathologic fracture Bone scan 06/16/2021-slight increase in radiotracer at the left greater trochanter, stable uptake at the right acetabulum and superior pubic ramus, stable polyarticular degenerative changes, similar uptake  in the mandible-periodontal disease? Status post CT guided right superior acetabular bone biopsy on 02/28/2013. Pathology showed metastatic carcinoma consistent with breast primary; ER positive (90-100%); PR weakly positive (5-10%); HER-2/neu not amplified; Ki-67  20%. CT chest/abdomen/pelvis 02/28/2013 with mild thyroid enlargement and multiple low-attenuation lesions probably due to multinodular goiter; suggestion of asymmetry in the deep lateral central left breast; cholelithiasis; small left renal cysts; degenerative changes throughout the spine; no lymphadenopathy in the abdomen or pelvis; no concerning solid organ lesions; mixed lytic and sclerotic destructive lesion of the right acetabulum. History of Elevated calcium level-likely related to taking a potassium supplement prior to the blood draws Thyroid ultrasound 01/15/2018 - compared to June 2015-stable thyromegaly, bilateral nodules do not meet criteria for biopsy     Disposition: Ms. Mcpike appears stable.  There is no clinical evidence for progression of breast cancer.  She continues anastrozole.  She continues every 18-monthZometa.  She will return for an office visit in 6 months.  She would like to wait space out the restaging bone scans unless she develops new symptoms.  She will contact [place] the [place]hthalmologist has a suspicion for malignancy.  GBetsy Coder MD  03/16/2022  9:07 AM

## 2022-06-06 DIAGNOSIS — C50919 Malignant neoplasm of unspecified site of unspecified female breast: Secondary | ICD-10-CM | POA: Diagnosis not present

## 2022-06-06 DIAGNOSIS — E1169 Type 2 diabetes mellitus with other specified complication: Secondary | ICD-10-CM | POA: Diagnosis not present

## 2022-06-06 DIAGNOSIS — E042 Nontoxic multinodular goiter: Secondary | ICD-10-CM | POA: Diagnosis not present

## 2022-06-06 DIAGNOSIS — E7800 Pure hypercholesterolemia, unspecified: Secondary | ICD-10-CM | POA: Diagnosis not present

## 2022-06-06 DIAGNOSIS — I10 Essential (primary) hypertension: Secondary | ICD-10-CM | POA: Diagnosis not present

## 2022-06-06 DIAGNOSIS — I700 Atherosclerosis of aorta: Secondary | ICD-10-CM | POA: Diagnosis not present

## 2022-06-06 DIAGNOSIS — E119 Type 2 diabetes mellitus without complications: Secondary | ICD-10-CM | POA: Diagnosis not present

## 2022-06-06 DIAGNOSIS — C7951 Secondary malignant neoplasm of bone: Secondary | ICD-10-CM | POA: Diagnosis not present

## 2022-06-15 ENCOUNTER — Telehealth: Payer: Self-pay | Admitting: *Deleted

## 2022-06-15 ENCOUNTER — Inpatient Hospital Stay: Payer: Medicare PPO

## 2022-06-15 ENCOUNTER — Inpatient Hospital Stay: Payer: Medicare PPO | Attending: Oncology

## 2022-06-15 VITALS — BP 119/75 | HR 69 | Temp 98.1°F | Resp 20 | Ht 62.0 in | Wt 132.9 lb

## 2022-06-15 DIAGNOSIS — C50911 Malignant neoplasm of unspecified site of right female breast: Secondary | ICD-10-CM | POA: Diagnosis not present

## 2022-06-15 DIAGNOSIS — Z170 Estrogen receptor positive status [ER+]: Secondary | ICD-10-CM | POA: Insufficient documentation

## 2022-06-15 DIAGNOSIS — C7951 Secondary malignant neoplasm of bone: Secondary | ICD-10-CM | POA: Insufficient documentation

## 2022-06-15 DIAGNOSIS — Z79811 Long term (current) use of aromatase inhibitors: Secondary | ICD-10-CM | POA: Diagnosis not present

## 2022-06-15 LAB — BASIC METABOLIC PANEL - CANCER CENTER ONLY
Anion gap: 10 (ref 5–15)
BUN: 20 mg/dL (ref 8–23)
CO2: 25 mmol/L (ref 22–32)
Calcium: 10 mg/dL (ref 8.9–10.3)
Chloride: 102 mmol/L (ref 98–111)
Creatinine: 0.91 mg/dL (ref 0.44–1.00)
GFR, Estimated: >60 mL/min (ref >60)
Glucose, Bld: 236 mg/dL — ABNORMAL HIGH (ref 70–99)
Potassium: 3.9 mmol/L (ref 3.5–5.1)
Sodium: 137 mmol/L (ref 135–145)

## 2022-06-15 MED ORDER — ZOLEDRONIC ACID 4 MG/100ML IV SOLN
4.0000 mg | Freq: Once | INTRAVENOUS | Status: AC
  Administered 2022-06-15: 4 mg via INTRAVENOUS
  Filled 2022-06-15: qty 100

## 2022-06-15 MED ORDER — SODIUM CHLORIDE 0.9 % IV SOLN: INTRAVENOUS | Status: DC

## 2022-06-15 NOTE — Patient Instructions (Signed)

## 2022-06-15 NOTE — Telephone Encounter (Signed)
Ca+ 5/58 per PCP 11.6 (did not hold med before draw) and was told to stop her oral calcium Today is normal and she held dose a couple days prior. Per Dr. Truett Perna: continue Ca+ daily and hold 1-2 days prior to lab draws. Needs to take due to zometa, her age and being on anastrozole. She understands and agrees to this plan.

## 2022-07-17 ENCOUNTER — Encounter: Payer: Self-pay | Admitting: Oncology

## 2022-08-03 DIAGNOSIS — D492 Neoplasm of unspecified behavior of bone, soft tissue, and skin: Secondary | ICD-10-CM | POA: Diagnosis not present

## 2022-08-03 DIAGNOSIS — H40023 Open angle with borderline findings, high risk, bilateral: Secondary | ICD-10-CM | POA: Diagnosis not present

## 2022-08-03 DIAGNOSIS — E113293 Type 2 diabetes mellitus with mild nonproliferative diabetic retinopathy without macular edema, bilateral: Secondary | ICD-10-CM | POA: Diagnosis not present

## 2022-09-21 ENCOUNTER — Inpatient Hospital Stay: Payer: Medicare PPO | Attending: Oncology | Admitting: Oncology

## 2022-09-21 ENCOUNTER — Inpatient Hospital Stay: Payer: Medicare PPO

## 2022-09-21 ENCOUNTER — Encounter: Payer: Self-pay | Admitting: *Deleted

## 2022-09-21 VITALS — BP 132/58 | HR 79 | Temp 98.1°F | Resp 18 | Ht 62.0 in | Wt 135.9 lb

## 2022-09-21 DIAGNOSIS — C50911 Malignant neoplasm of unspecified site of right female breast: Secondary | ICD-10-CM

## 2022-09-21 DIAGNOSIS — R202 Paresthesia of skin: Secondary | ICD-10-CM | POA: Diagnosis not present

## 2022-09-21 DIAGNOSIS — C7951 Secondary malignant neoplasm of bone: Secondary | ICD-10-CM | POA: Diagnosis not present

## 2022-09-21 DIAGNOSIS — Z79811 Long term (current) use of aromatase inhibitors: Secondary | ICD-10-CM | POA: Diagnosis not present

## 2022-09-21 DIAGNOSIS — Z170 Estrogen receptor positive status [ER+]: Secondary | ICD-10-CM | POA: Diagnosis not present

## 2022-09-21 DIAGNOSIS — Z923 Personal history of irradiation: Secondary | ICD-10-CM | POA: Insufficient documentation

## 2022-09-21 DIAGNOSIS — Z9011 Acquired absence of right breast and nipple: Secondary | ICD-10-CM | POA: Diagnosis not present

## 2022-09-21 LAB — BASIC METABOLIC PANEL - CANCER CENTER ONLY
Anion gap: 14 (ref 5–15)
BUN: 22 mg/dL (ref 8–23)
CO2: 25 mmol/L (ref 22–32)
Calcium: 9.7 mg/dL (ref 8.9–10.3)
Chloride: 100 mmol/L (ref 98–111)
Creatinine: 0.76 mg/dL (ref 0.44–1.00)
GFR, Estimated: >60 mL/min (ref >60)
Glucose, Bld: 185 mg/dL — ABNORMAL HIGH (ref 70–99)
Potassium: 4 mmol/L (ref 3.5–5.1)
Sodium: 139 mmol/L (ref 135–145)

## 2022-09-21 MED ORDER — SODIUM CHLORIDE 0.9 % IV SOLN: INTRAVENOUS | Status: DC

## 2022-09-21 MED ORDER — ZOLEDRONIC ACID 4 MG/100ML IV SOLN
4.0000 mg | Freq: Once | INTRAVENOUS | Status: AC
  Administered 2022-09-21: 4 mg via INTRAVENOUS
  Filled 2022-09-21: qty 100

## 2022-09-21 NOTE — Patient Instructions (Signed)

## 2022-09-21 NOTE — Progress Notes (Signed)
Patient seen by Dr. Thornton Papas today  Vitals are within treatment parameters.No intervention for BP 151/71  Labs reviewed by Dr. Truett Perna and are within treatment parameters.  Per physician team, patient is ready for treatment and there are NO modifications to the treatment plan.

## 2022-09-21 NOTE — Progress Notes (Signed)
[facility] OFFICE PROGRESS NOTE   Diagnosis: Breast cancer  INTERVAL HISTORY:   Cindy Frazier returns as scheduled.  She continues anastrozole.  She continues every 3 months Zometa.  No jaw pain.  She reports "tingling "in the right lower leg for the past several months.  No numbness.  She has chronic mild weakness of the right leg following right hip radiation.  No back pain.  No difficulty with bowel or bladder function.  Objective:  Vital signs in last 24 hours:  Blood pressure (!) 132/58, pulse 79, temperature 98.1 F (36.7 C), temperature source Oral, resp. rate 18, height 5\' 2"  (1.575 m), weight 135 lb 14.4 oz (61.6 kg), SpO2 99%.    Lymphatics: No cervical, supraclavicular, axillary, or inguinal nodes Resp: Lungs clear bilaterally Cardio: Regular rate and rhythm GI: No hepatosplenomegaly Vascular: No leg edema Neuro: 4/5 strength with flexion at the right hip.  The leg and foot strength is otherwise intact.  The deep tendon reflexes are diminished at the knee bilaterally.  2+ reflexes at the biceps bilaterally. Breast: Status post right mastectomy with a TRAM reconstruction.  No evidence for chest wall tumor recurrence. Musculoskeletal: No pain with motion at the right hip  Lab Results:  Lab Results  Component Value Date   WBC 7.2 02/21/2013   HGB 13.8 02/21/2013   HCT 43.9 02/21/2013   MCV 83.5 02/21/2013   PLT 291 02/21/2013   NEUTROABS 4.2 02/21/2013    CMP  Lab Results  Component Value Date   NA 137 06/15/2022   K 3.9 06/15/2022   CL 102 06/15/2022   CO2 25 06/15/2022   GLUCOSE 236 (H) 06/15/2022   BUN 20 06/15/2022   CREATININE 0.91 06/15/2022   CALCIUM 10.0 06/15/2022   PROT 7.1 01/04/2021   ALBUMIN 4.1 01/04/2021   AST 12 (L) 01/04/2021   ALT 14 01/04/2021   ALKPHOS 44 01/04/2021   BILITOT 0.5 01/04/2021   GFRNONAA >60 06/15/2022   GFRAA >60 09/16/2019     Medications: I have reviewed the patient's current  medications.   Assessment/Plan: Breast cancer-stage IIA (T2 N0) right breast cancer diagnosed in January 2000, status post a right mastectomy and TRAM reconstruction. She completed adjuvant chemotherapy followed by 5 years of tamoxifen and then 5 years of Femara. She completed Femara at the end of July 2010.   Right groin pain with an MRI and bone scan February 2015 concerning for metastatic disease involving the right acetabulum/ileum. Cycle 1 Zometa 03/21/2013   Status post SBRT to the right acetabulum completed 03/26/2013   Initiation of Arimidex 03/27/2013. Bone scan 09/23/2013. Metastatic disease in the right hemipelvis. Overall degree of activity decreased when compared to the prior exam. Bone scan 04/21/2014. Right pelvic/acetabular uptake slightly decreased since the prior exam. Bone scan 10/02/2014-unchanged metastasis at the right acetabulum, no new metastases, activity at the right greater than left mandible felt to reflect dental disease Bone scan 06/18/2015-no change in the right acetabulum metastasis. No new metastases.   Bone scan 04/22/2016-no change in right acetabulum metastasis, no new metastases CTs chest, abdomen, and pelvis on 12/28/2017- lytic/sclerotic lesion of the right acetabulum, no other bone lesions, no other evidence of metastatic disease in the chest, abdomen, and pelvis, multiple thyroid nodules Bone scan 12/17/2018-new focal area of increased activity at the mid right cervical spine-felt to most likely be degenerative after review with radiology, unchanged activity at the right acetabulum CT right hip 06/28/2020-radiation changes involving the right acetabulum with lytic and  sclerotic pattern, no evidence of recurrent tumor or pathologic fracture Bone scan 06/16/2021-slight increase in radiotracer at the left greater trochanter, stable uptake at the right acetabulum and superior pubic ramus, stable polyarticular degenerative changes, similar uptake in the  mandible-periodontal disease? Status post CT guided right superior acetabular bone biopsy on 02/28/2013. Pathology showed metastatic carcinoma consistent with breast primary; ER positive (90-100%); PR weakly positive (5-10%); HER-2/neu not amplified; Ki-67 20%. CT chest/abdomen/pelvis 02/28/2013 with mild thyroid enlargement and multiple low-attenuation lesions probably due to multinodular goiter; suggestion of asymmetry in the deep lateral central left breast; cholelithiasis; small left renal cysts; degenerative changes throughout the spine; no lymphadenopathy in the abdomen or pelvis; no concerning solid organ lesions; mixed lytic and sclerotic destructive lesion of the right acetabulum. History of Elevated calcium level-likely related to taking a potassium supplement prior to the blood draws Thyroid ultrasound 01/15/2018 - compared to June 2015-stable thyromegaly, bilateral nodules do not meet criteria for biopsy       Disposition: Ms. Cindy Frazier remains in clinical remission from breast cancer.  She continues anastrozole.  She continues every 30-month Zometa.  There is no clinical evidence for disease progression.  We will plan for a restaging bone scan next year.  The etiology of the right leg tingling is unclear.  She will be referred for an MRI of the lumbar spine.  We will refer her to neurology if the tingling persist in the MRI is nondiagnostic.  She will call for development of new neurologic symptoms or back pain.  Thornton Papas, MD  09/21/2022  9:04 AM

## 2022-09-27 ENCOUNTER — Ambulatory Visit (HOSPITAL_COMMUNITY)
Admission: RE | Admit: 2022-09-27 | Discharge: 2022-09-27 | Disposition: A | Discharge status: Home / Self Care | Payer: Medicare PPO | Source: Ambulatory Visit | Attending: Oncology | Admitting: Oncology

## 2022-09-27 DIAGNOSIS — C50911 Malignant neoplasm of unspecified site of right female breast: Secondary | ICD-10-CM | POA: Diagnosis not present

## 2022-09-27 DIAGNOSIS — C7951 Secondary malignant neoplasm of bone: Secondary | ICD-10-CM | POA: Diagnosis not present

## 2022-09-27 DIAGNOSIS — M5136 Other intervertebral disc degeneration, lumbar region: Secondary | ICD-10-CM | POA: Diagnosis not present

## 2022-09-27 DIAGNOSIS — K8020 Calculus of gallbladder without cholecystitis without obstruction: Secondary | ICD-10-CM | POA: Diagnosis not present

## 2022-09-27 DIAGNOSIS — M48061 Spinal stenosis, lumbar region without neurogenic claudication: Secondary | ICD-10-CM | POA: Diagnosis not present

## 2022-09-27 DIAGNOSIS — C50919 Malignant neoplasm of unspecified site of unspecified female breast: Secondary | ICD-10-CM | POA: Diagnosis not present

## 2022-10-05 ENCOUNTER — Other Ambulatory Visit: Payer: Self-pay | Admitting: *Deleted

## 2022-10-05 DIAGNOSIS — C50919 Malignant neoplasm of unspecified site of unspecified female breast: Secondary | ICD-10-CM

## 2022-10-05 MED ORDER — ANASTROZOLE 1 MG PO TABS: 1.0000 mg | ORAL_TABLET | Freq: Every day | ORAL | 11 refills | Status: DC

## 2022-10-05 NOTE — Telephone Encounter (Signed)
Cindy Frazier called for refill on Arimidex. Refill approved

## 2022-10-12 ENCOUNTER — Encounter: Payer: Self-pay | Admitting: Oncology

## 2022-10-13 ENCOUNTER — Encounter: Payer: Self-pay | Admitting: *Deleted

## 2022-10-16 ENCOUNTER — Other Ambulatory Visit: Payer: Self-pay | Admitting: *Deleted

## 2022-10-16 ENCOUNTER — Encounter: Payer: Self-pay | Admitting: Neurology

## 2022-10-16 DIAGNOSIS — C50911 Malignant neoplasm of unspecified site of right female breast: Secondary | ICD-10-CM

## 2022-10-16 NOTE — Progress Notes (Signed)
Placed referral to [facility] for weakness/tingling in right leg.

## 2022-10-23 ENCOUNTER — Encounter: Payer: Self-pay | Admitting: Neurology

## 2022-11-15 ENCOUNTER — Encounter: Payer: Self-pay | Admitting: Neurology

## 2022-11-15 ENCOUNTER — Ambulatory Visit: Payer: Medicare PPO | Admitting: Neurology

## 2022-11-15 VITALS — BP 129/76 | HR 95 | Ht 62.0 in | Wt 130.0 lb

## 2022-11-15 DIAGNOSIS — M48061 Spinal stenosis, lumbar region without neurogenic claudication: Secondary | ICD-10-CM

## 2022-11-15 DIAGNOSIS — R202 Paresthesia of skin: Secondary | ICD-10-CM

## 2022-11-15 NOTE — Patient Instructions (Addendum)
We will refer you to physical therapy for low back stretching and strengthening  Nerve testing of the right > left leg  ELECTROMYOGRAM AND NERVE CONDUCTION STUDIES (EMG/NCS) INSTRUCTIONS  How to Prepare The neurologist conducting the EMG will need to know if you have certain medical conditions. Tell the neurologist and other EMG lab personnel if you: Have a pacemaker or any other electrical medical device Take blood-thinning medications Have hemophilia, a blood-clotting disorder that causes prolonged bleeding Bathing Take a shower or bath shortly before your exam in order to remove oils from your skin. Don't apply lotions or creams before the exam.  What to Expect You'll likely be asked to change into a hospital gown for the procedure and lie down on an examination table. The following explanations can help you understand what will happen during the exam.  Electrodes. The neurologist or a technician places surface electrodes at various locations on your skin depending on where you're experiencing symptoms. Or the neurologist may insert needle electrodes at different sites depending on your symptoms.  Sensations. The electrodes will at times transmit a tiny electrical current that you may feel as a twinge or spasm. The needle electrode may cause discomfort or pain that usually ends shortly after the needle is removed. If you are concerned about discomfort or pain, you may want to talk to the neurologist about taking a short break during the exam.  Instructions. During the needle EMG, the neurologist will assess whether there is any spontaneous electrical activity when the muscle is at rest - activity that isn't present in healthy muscle tissue - and the degree of activity when you slightly contract the muscle.  He or she will give you instructions on resting and contracting a muscle at appropriate times. Depending on what muscles and nerves the neurologist is examining, he or she may ask you to  change positions during the exam.  After your EMG You may experience some temporary, minor bruising where the needle electrode was inserted into your muscle. This bruising should fade within several days. If it persists, contact your primary care doctor.

## 2022-11-15 NOTE — Progress Notes (Signed)
[facility] Clinic Note - Initial Visit   Date: 11/15/2022   Cindy Frazier MRN: [id number] DOB: 05-Aug-1950   Dear Dr. Truett Perna:  Thank you for your kind referral of Cindy Frazier for consultation of right leg tingling. Although her history is well known to you, please allow [place] to reiterate it for the purpose of our medical record. The patient was accompanied to the clinic by self.    Cindy Frazier is a 72 y.o. right-handed female with right breast cancer (2000) with metastasis to the right hip (2015) s/p mastectomy, radiation, and chemotherapy, diabetes mellitus, hyperlipidemia, and hypertension presenting for evaluation of right leg numbness.   IMPRESSION/PLAN: Right leg numbness with trace weakness with dorsiflexion, foot eversion and inversion is intact.  MRI lumbar spine shows stenosis at L4-5 which could impinge bilateral L5 nerve roots.  Her paresthesias do fit L5 dermatome.  Alternatively, peroneal mononeuropathy also can have similar presentation.  To better localize her symptoms, I will order NCS/EMG of the right > left leg.  I will also refer her to physical therapy for low back strengthening for spinal stenosis.   Return to clinic in 3 months  ------------------------------------------------------------- History of present illness: Starting around early January 2024, she began having tingling involving the right lower leg and possibly over the foot.  Symptoms are constant and worse when she is standing.  She is not aware of it when she is sitting, resting, or laying in bed.  She denies associated weakness of the right leg. She feels that it may be affecting her balance.  She had not had any falls.  She denies similar symptoms in the left leg.  She denies pain in the leg or low back pain.   Out-side paper records, electronic medical record, and images have been reviewed where available and summarized as:  MRI lumbar spine wo contrast  10/12/2022: 1. Multilevel lumbar disc and facet degeneration, most notable at L4-5 where there is moderate to severe spinal stenosis. 2. Cholelithiasis.  Lab Results  Component Value Date   HGBA1C 6.7 (H) 06/21/2018   No results found for: "VITAMINB12" Lab Results  Component Value Date   TSH 0.645 06/21/2018   No results found for: "ESRSEDRATE", "POCTSEDRATE"  Past Medical History:  Diagnosis Date   Bone cancer (HCC)    Breast cancer (HCC) 01/1998   Stage II-A Right sided   Diabetes mellitus without complication (HCC)    Hyperlipidemia    Hypertension    Personal history of chemotherapy     Past Surgical History:  Procedure Laterality Date   APPENDECTOMY     MASTECTOMY Right    malignant, reconstruction Tram flap     Medications:  Outpatient Encounter Medications as of 11/15/2022  Medication Sig Note   anastrozole (ARIMIDEX) 1 MG tablet Take 1 tablet (1 mg total) by mouth daily.    Calcium Carbonate-Vitamin D (CALCIUM + D PO) Take 1 tablet by mouth daily. 09/21/2022: Holds few days prior to zometa   CRESTOR 20 MG tablet Take 20 mg by mouth Daily. 12/18/2019: Takes every other day   GLIPIZIDE PO Take 5 mg by mouth daily.     INVOKANA 300 MG TABS Take 300 mg by mouth daily. 02/20/2013: Received from: External Pharmacy Received Sig:    latanoprost (XALATAN) 0.005 % ophthalmic solution Place 1 drop into both eyes at bedtime.  01/14/2015: Received from: External Pharmacy   losartan-hydrochlorothiazide (HYZAAR) 100-25 MG per tablet Take 1 tablet by mouth Daily.  metFORMIN (GLUCOPHAGE-XR) 500 MG 24 hr tablet 2 tablets    sitaGLIPtin (JANUVIA) 100 MG tablet 1 tablet    Zoledronic Acid (ZOMETA IV) Inject into the vein. EVERY 3 MONTHS    No facility-administered encounter medications on file as of 11/15/2022.    Allergies:  Allergies  Allergen Reactions   Erythromycin Other (See Comments)    GI Intolerance    Family History: Family History  Problem Relation Age of Onset    Liver disease Mother    Heart disease Father    Diabetes Father    Cancer Sister        Uterus   Breast cancer Neg Hx     Social History: Social History   Tobacco Use   Smoking status: Former    Types: Cigarettes   Smokeless tobacco: Never  Substance Use Topics   Alcohol use: No   Drug use: No   Social History   Social History Narrative   Are you right handed or left handed? Right Handed    Are you currently employed ? No    What is your current occupation? Retired    Do you live at home alone? No   Who lives with you? Husband    What type of home do you live in: 1 story or 2 story? Lives in a two story home - split level         Vital Signs:  BP 129/76   Pulse 95   Ht 5\' 2"  (1.575 m)   Wt 130 lb (59 kg)   SpO2 100%   BMI 23.78 kg/m    Neurological Exam: MENTAL STATUS including orientation to time, place, person, recent and remote memory, attention span and concentration, language, and fund of knowledge is normal.  Speech is not dysarthric.  CRANIAL NERVES: II:  No visual field defects.     III-IV-VI: Pupils equal round and reactive to light.  Normal conjugate, extra-ocular eye movements in all directions of gaze.  No nystagmus.  No ptosis.   V:  Normal facial sensation.    VII:  Normal facial symmetry and movements.   VIII:  Normal hearing and vestibular function.   IX-X:  Normal palatal movement.   XI:  Normal shoulder shrug and head rotation.   XII:  Normal tongue strength and range of motion, no deviation or fasciculation.  MOTOR:  No atrophy, fasciculations or abnormal movements.  No pronator drift.   Upper Extremity:  Right  Left  Deltoid  5/5   5/5   Biceps  5/5   5/5   Triceps  5/5   5/5   Wrist extensors  5/5   5/5   Wrist flexors  5/5   5/5   Finger extensors  5/5   5/5   Finger flexors  5/5   5/5   Dorsal interossei  5/5   5/5   Abductor pollicis  5/5   5/5   Tone (Ashworth scale)  0  0   Lower Extremity:  Right  Left  Hip flexors  5/5    5/5   Knee flexors  5/5   5/5   Knee extensors  5/5   5/5   Dorsiflexors  5/5   5/5   Plantarflexors  5-/5   5/5   Eversion 5/5  5/5  Inversion 5/5  5/5  Toe extensors  5/5   5/5   Toe flexors  5/5   5/5   Tone (Ashworth scale)  0  0  MSRs:                                           Right        Left brachioradialis 2+  2+  biceps 2+  2+  triceps 2+  2+  patellar 2+  2+  ankle jerk 2+  2+  Hoffman no  no  plantar response down  down   SENSORY:  Normal and symmetric perception of light touch, pinprick, vibration, and temperature.  Romberg's sign absent.   COORDINATION/GAIT: Normal finger-to- nose-finger.  Intact rapid alternating movements bilaterally.   Gait narrow based and stable. Tandem and stressed gait intact, although with heel walking she is unable to raise the right toos as high as the left.      Thank you for allowing me to participate in patient's care.  If I can answer any additional questions, I would be pleased to do so.    Sincerely,    Marthe Dant K. Allena Katz, DO

## 2022-11-24 DIAGNOSIS — M4805 Spinal stenosis, thoracolumbar region: Secondary | ICD-10-CM | POA: Diagnosis not present

## 2022-11-24 DIAGNOSIS — G629 Polyneuropathy, unspecified: Secondary | ICD-10-CM | POA: Diagnosis not present

## 2022-12-19 DIAGNOSIS — Z0000 Encounter for general adult medical examination without abnormal findings: Secondary | ICD-10-CM | POA: Diagnosis not present

## 2022-12-19 DIAGNOSIS — M4805 Spinal stenosis, thoracolumbar region: Secondary | ICD-10-CM | POA: Diagnosis not present

## 2022-12-19 DIAGNOSIS — G5791 Unspecified mononeuropathy of right lower limb: Secondary | ICD-10-CM | POA: Diagnosis not present

## 2022-12-19 DIAGNOSIS — I10 Essential (primary) hypertension: Secondary | ICD-10-CM | POA: Diagnosis not present

## 2022-12-19 DIAGNOSIS — C7951 Secondary malignant neoplasm of bone: Secondary | ICD-10-CM | POA: Diagnosis not present

## 2022-12-19 DIAGNOSIS — C50911 Malignant neoplasm of unspecified site of right female breast: Secondary | ICD-10-CM | POA: Diagnosis not present

## 2022-12-19 DIAGNOSIS — I700 Atherosclerosis of aorta: Secondary | ICD-10-CM | POA: Diagnosis not present

## 2022-12-19 DIAGNOSIS — G629 Polyneuropathy, unspecified: Secondary | ICD-10-CM | POA: Diagnosis not present

## 2022-12-19 DIAGNOSIS — E1165 Type 2 diabetes mellitus with hyperglycemia: Secondary | ICD-10-CM | POA: Diagnosis not present

## 2022-12-19 DIAGNOSIS — E042 Nontoxic multinodular goiter: Secondary | ICD-10-CM | POA: Diagnosis not present

## 2022-12-19 DIAGNOSIS — E7800 Pure hypercholesterolemia, unspecified: Secondary | ICD-10-CM | POA: Diagnosis not present

## 2022-12-21 ENCOUNTER — Inpatient Hospital Stay: Payer: Medicare PPO

## 2022-12-21 ENCOUNTER — Inpatient Hospital Stay: Payer: Medicare PPO | Attending: Oncology

## 2022-12-21 VITALS — BP 115/49 | HR 64 | Temp 98.1°F | Resp 18

## 2022-12-21 DIAGNOSIS — C7951 Secondary malignant neoplasm of bone: Secondary | ICD-10-CM | POA: Insufficient documentation

## 2022-12-21 DIAGNOSIS — Z79811 Long term (current) use of aromatase inhibitors: Secondary | ICD-10-CM | POA: Diagnosis not present

## 2022-12-21 DIAGNOSIS — Z170 Estrogen receptor positive status [ER+]: Secondary | ICD-10-CM | POA: Diagnosis not present

## 2022-12-21 DIAGNOSIS — C50911 Malignant neoplasm of unspecified site of right female breast: Secondary | ICD-10-CM

## 2022-12-21 LAB — BASIC METABOLIC PANEL - CANCER CENTER ONLY
Anion gap: 9 (ref 5–15)
BUN: 19 mg/dL (ref 8–23)
CO2: 27 mmol/L (ref 22–32)
Calcium: 10.1 mg/dL (ref 8.9–10.3)
Chloride: 102 mmol/L (ref 98–111)
Creatinine: 0.83 mg/dL (ref 0.44–1.00)
GFR, Estimated: >60 mL/min (ref >60)
Glucose, Bld: 147 mg/dL — ABNORMAL HIGH (ref 70–99)
Potassium: 4 mmol/L (ref 3.5–5.1)
Sodium: 138 mmol/L (ref 135–145)

## 2022-12-21 MED ORDER — SODIUM CHLORIDE 0.9 % IV SOLN: Freq: Once | INTRAVENOUS | Status: AC

## 2022-12-21 MED ORDER — HEPARIN SOD (PORK) LOCK FLUSH 100 UNIT/ML IV SOLN: 500.0000 [IU] | Freq: Once | INTRAVENOUS | Status: DC | PRN

## 2022-12-21 MED ORDER — ALTEPLASE 2 MG IJ SOLR: 2.0000 mg | Freq: Once | INTRAMUSCULAR | Status: DC | PRN

## 2022-12-21 MED ORDER — ZOLEDRONIC ACID 4 MG/100ML IV SOLN
4.0000 mg | Freq: Once | INTRAVENOUS | Status: AC
  Administered 2022-12-21: 4 mg via INTRAVENOUS
  Filled 2022-12-21: qty 100

## 2022-12-21 NOTE — Patient Instructions (Signed)

## 2023-01-08 ENCOUNTER — Encounter: Payer: Self-pay | Admitting: Oncology

## 2023-01-09 ENCOUNTER — Other Ambulatory Visit: Payer: Self-pay | Admitting: Oncology

## 2023-01-09 DIAGNOSIS — Z1231 Encounter for screening mammogram for malignant neoplasm of breast: Secondary | ICD-10-CM

## 2023-01-19 ENCOUNTER — Encounter: Payer: Medicare PPO | Admitting: Neurology

## 2023-02-09 ENCOUNTER — Ambulatory Visit
Admission: RE | Admit: 2023-02-09 | Discharge: 2023-02-09 | Disposition: A | Discharge status: Home / Self Care | Payer: Medicare PPO | Source: Ambulatory Visit | Attending: Oncology | Admitting: Oncology

## 2023-02-09 DIAGNOSIS — Z1231 Encounter for screening mammogram for malignant neoplasm of breast: Secondary | ICD-10-CM

## 2023-02-14 ENCOUNTER — Ambulatory Visit: Payer: Medicare PPO | Admitting: Neurology

## 2023-03-07 ENCOUNTER — Other Ambulatory Visit: Payer: Self-pay | Admitting: *Deleted

## 2023-03-07 DIAGNOSIS — C50911 Malignant neoplasm of unspecified site of right female breast: Secondary | ICD-10-CM

## 2023-03-15 ENCOUNTER — Inpatient Hospital Stay: Payer: Medicare PPO

## 2023-03-15 ENCOUNTER — Inpatient Hospital Stay: Payer: Medicare PPO | Admitting: Oncology

## 2023-03-15 ENCOUNTER — Inpatient Hospital Stay: Payer: Medicare PPO | Attending: Oncology

## 2023-03-16 ENCOUNTER — Ambulatory Visit: Payer: Medicare PPO | Admitting: Oncology

## 2023-03-16 ENCOUNTER — Ambulatory Visit: Payer: Medicare PPO

## 2023-03-16 ENCOUNTER — Other Ambulatory Visit: Payer: Self-pay

## 2023-03-16 ENCOUNTER — Other Ambulatory Visit: Payer: Medicare PPO

## 2023-04-11 ENCOUNTER — Inpatient Hospital Stay

## 2023-04-11 ENCOUNTER — Inpatient Hospital Stay: Attending: Oncology

## 2023-04-11 ENCOUNTER — Inpatient Hospital Stay: Admitting: Oncology

## 2023-04-11 VITALS — BP 118/59 | HR 76 | Temp 97.7°F | Resp 18 | Ht 62.0 in | Wt 127.4 lb

## 2023-04-11 DIAGNOSIS — C7951 Secondary malignant neoplasm of bone: Secondary | ICD-10-CM | POA: Insufficient documentation

## 2023-04-11 DIAGNOSIS — Z79811 Long term (current) use of aromatase inhibitors: Secondary | ICD-10-CM | POA: Insufficient documentation

## 2023-04-11 DIAGNOSIS — C50911 Malignant neoplasm of unspecified site of right female breast: Secondary | ICD-10-CM

## 2023-04-11 DIAGNOSIS — Z1721 Progesterone receptor positive status: Secondary | ICD-10-CM | POA: Insufficient documentation

## 2023-04-11 DIAGNOSIS — Z170 Estrogen receptor positive status [ER+]: Secondary | ICD-10-CM | POA: Diagnosis not present

## 2023-04-11 LAB — CMP (CANCER CENTER ONLY)
ALT: 13 U/L (ref 0–44)
AST: 12 U/L — ABNORMAL LOW (ref 15–41)
Albumin: 4.4 g/dL (ref 3.5–5.0)
Alkaline Phosphatase: 45 U/L (ref 38–126)
Anion gap: 13 (ref 5–15)
BUN: 17 mg/dL (ref 8–23)
CO2: 27 mmol/L (ref 22–32)
Calcium: 10.1 mg/dL (ref 8.9–10.3)
Chloride: 98 mmol/L (ref 98–111)
Creatinine: 0.9 mg/dL (ref 0.44–1.00)
GFR, Estimated: >60 mL/min (ref >60)
Glucose, Bld: 204 mg/dL — ABNORMAL HIGH (ref 70–99)
Potassium: 4 mmol/L (ref 3.5–5.1)
Sodium: 138 mmol/L (ref 135–145)
Total Bilirubin: 0.5 mg/dL (ref 0.0–1.2)
Total Protein: 7.6 g/dL (ref 6.5–8.1)

## 2023-04-11 MED ORDER — ZOLEDRONIC ACID 4 MG/100ML IV SOLN
4.0000 mg | Freq: Once | INTRAVENOUS | Status: AC
  Administered 2023-04-11: 4 mg via INTRAVENOUS
  Filled 2023-04-11: qty 100

## 2023-04-11 MED ORDER — SODIUM CHLORIDE 0.9 % IV SOLN: INTRAVENOUS | Status: DC

## 2023-04-11 NOTE — Progress Notes (Signed)
 [facility] OFFICE PROGRESS NOTE   Diagnosis: Breast cancer  INTERVAL HISTORY:   Cindy Frazier returns as scheduled.  She generally feels well.  She continues to have tingling in the right leg.  No weakness.  She saw Dr. Allena Katz felt the numbness could be related to L4-L5 stenosis seen on MRI versus peroneal mononeuropathy.  She recommended nerve conduction studies.  Cindy Frazier decided against this test.  She continues every 31-month Zometa.  No tooth loosening or bone pain.  She reports intermittent heartburn after eating.  This has been present for several years.  Baking soda with warm water helps.  She relates weight loss to not eating due to a fear of heartburn.  Objective:  Vital signs in last 24 hours:  Blood pressure (!) 118/59, pulse 76, temperature 97.7 F (36.5 C), temperature source Temporal, resp. rate 18, height 5\' 2"  (1.575 m), weight 127 lb 6.4 oz (57.8 kg), SpO2 100%.    HEENT: Neck without mass Lymphatics: No cervical, supraclavicular, or axillary nodes Resp: Lungs clear bilaterally Cardio: Regular rate and rhythm GI: No hepatosplenomegaly Vascular: No leg edema Neuro: Strength appears intact at the foot bilaterally Breast: Status post right mastectomy with a TRAM reconstruction.  No evidence for chest wall tumor recurrence   Lab Results:  Lab Results  Component Value Date   WBC 7.2 02/21/2013   HGB 13.8 02/21/2013   HCT 43.9 02/21/2013   MCV 83.5 02/21/2013   PLT 291 02/21/2013   NEUTROABS 4.2 02/21/2013    CMP  Lab Results  Component Value Date   NA 138 04/11/2023   K 4.0 04/11/2023   CL 98 04/11/2023   CO2 27 04/11/2023   GLUCOSE 204 (H) 04/11/2023   BUN 17 04/11/2023   CREATININE 0.90 04/11/2023   CALCIUM 10.1 04/11/2023   PROT 7.6 04/11/2023   ALBUMIN 4.4 04/11/2023   AST 12 (L) 04/11/2023   ALT 13 04/11/2023   ALKPHOS 45 04/11/2023   BILITOT 0.5 04/11/2023   GFRNONAA >60 04/11/2023   GFRAA >60 09/16/2019     Medications: I have reviewed the patient's current medications.   Assessment/Plan: Breast cancer-stage IIA (T2 N0) right breast cancer diagnosed in January 2000, status post a right mastectomy and TRAM reconstruction. She completed adjuvant chemotherapy followed by 5 years of tamoxifen and then 5 years of Femara. She completed Femara at the end of July 2010.   Right groin pain with an MRI and bone scan February 2015 concerning for metastatic disease involving the right acetabulum/ileum. Cycle 1 Zometa 03/21/2013   Status post SBRT to the right acetabulum completed 03/26/2013   Initiation of Arimidex 03/27/2013. Bone scan 09/23/2013. Metastatic disease in the right hemipelvis. Overall degree of activity decreased when compared to the prior exam. Bone scan 04/21/2014. Right pelvic/acetabular uptake slightly decreased since the prior exam. Bone scan 10/02/2014-unchanged metastasis at the right acetabulum, no new metastases, activity at the right greater than left mandible felt to reflect dental disease Bone scan 06/18/2015-no change in the right acetabulum metastasis. No new metastases.   Bone scan 04/22/2016-no change in right acetabulum metastasis, no new metastases CTs chest, abdomen, and pelvis on 12/28/2017- lytic/sclerotic lesion of the right acetabulum, no other bone lesions, no other evidence of metastatic disease in the chest, abdomen, and pelvis, multiple thyroid nodules Bone scan 12/17/2018-new focal area of increased activity at the mid right cervical spine-felt to most likely be degenerative after review with radiology, unchanged activity at the right acetabulum CT right hip 06/28/2020-radiation  changes involving the right acetabulum with lytic and sclerotic pattern, no evidence of recurrent tumor or pathologic fracture Bone scan 06/16/2021-slight increase in radiotracer at the left greater trochanter, stable uptake at the right acetabulum and superior pubic ramus, stable polyarticular  degenerative changes, similar uptake in the mandible-periodontal disease? Status post CT guided right superior acetabular bone biopsy on 02/28/2013. Pathology showed metastatic carcinoma consistent with breast primary; ER positive (90-100%); PR weakly positive (5-10%); HER-2/neu not amplified; Ki-67 20%. CT chest/abdomen/pelvis 02/28/2013 with mild thyroid enlargement and multiple low-attenuation lesions probably due to multinodular goiter; suggestion of asymmetry in the deep lateral central left breast; cholelithiasis; small left renal cysts; degenerative changes throughout the spine; no lymphadenopathy in the abdomen or pelvis; no concerning solid organ lesions; mixed lytic and sclerotic destructive lesion of the right acetabulum. History of Elevated calcium level-likely related to taking a potassium supplement prior to the blood draws Thyroid ultrasound 01/15/2018 - compared to June 2015-stable thyromegaly, bilateral nodules do not meet criteria for biopsy        Disposition: Cindy Frazier remains in clinical remission from breast cancer.  She continues anastrozole.  She is maintained on every 58-month Zometa.  She will complete another treatment with Zometa today.  She will return for Zometa and a surveillance bone scan in 3 months.  She crosses her right leg over the left knee frequently.  It is possible the right leg neurologic symptoms are related to peroneal nerve compression.  She will try not crossing her legs and see if this helps.  Cindy Frazier will return for an office visit in 6 months.  She will discuss reflux symptoms with Dr. Jacqulyn Bath.  Thornton Papas, MD  04/11/2023  11:09 AM

## 2023-04-11 NOTE — Patient Instructions (Signed)

## 2023-04-11 NOTE — Progress Notes (Signed)
 Patient seen by Dr. Thornton Papas today  Vitals are within treatment parameters:Yes   Labs are within treatment parameters: Yes   Treatment plan has been signed: Yes   Per physician team, Patient is ready for treatment and there are NO modifications to the treatment plan. IV stick only on L side. Vein finder is helpful-she is a difficult stick.

## 2023-04-12 ENCOUNTER — Telehealth: Payer: Self-pay | Admitting: Oncology

## 2023-04-12 NOTE — Telephone Encounter (Signed)
 Pts appts for Oct have been scheduled. Pt did prefer to wait to schedule July appts until bone scan is scheduled since she wants to ensure she has enough time to get to [facility] for injection prior to the scan.

## 2023-04-12 NOTE — Telephone Encounter (Signed)
 Contacted pt to schedule an appt per 04/11/23 LOS. Unable to reach via phone, voicemail was left.

## 2023-05-03 ENCOUNTER — Telehealth: Payer: Self-pay | Admitting: *Deleted

## 2023-05-03 NOTE — Telephone Encounter (Signed)
 Called Cindy Frazier with the 07/18/23 appointments at [facility] and radiology. Have requested her IV be saline locked after Zometa  for nuclear med to use since she is such a difficult stick.

## 2023-06-18 DIAGNOSIS — C50919 Malignant neoplasm of unspecified site of unspecified female breast: Secondary | ICD-10-CM | POA: Diagnosis not present

## 2023-06-18 DIAGNOSIS — E1169 Type 2 diabetes mellitus with other specified complication: Secondary | ICD-10-CM | POA: Diagnosis not present

## 2023-06-18 DIAGNOSIS — I10 Essential (primary) hypertension: Secondary | ICD-10-CM | POA: Diagnosis not present

## 2023-06-18 DIAGNOSIS — E7800 Pure hypercholesterolemia, unspecified: Secondary | ICD-10-CM | POA: Diagnosis not present

## 2023-06-18 DIAGNOSIS — E1165 Type 2 diabetes mellitus with hyperglycemia: Secondary | ICD-10-CM | POA: Diagnosis not present

## 2023-06-18 DIAGNOSIS — C7951 Secondary malignant neoplasm of bone: Secondary | ICD-10-CM | POA: Diagnosis not present

## 2023-06-18 DIAGNOSIS — K219 Gastro-esophageal reflux disease without esophagitis: Secondary | ICD-10-CM | POA: Diagnosis not present

## 2023-06-18 DIAGNOSIS — G5791 Unspecified mononeuropathy of right lower limb: Secondary | ICD-10-CM | POA: Diagnosis not present

## 2023-06-18 DIAGNOSIS — I700 Atherosclerosis of aorta: Secondary | ICD-10-CM | POA: Diagnosis not present

## 2023-07-18 ENCOUNTER — Inpatient Hospital Stay

## 2023-07-18 ENCOUNTER — Encounter (HOSPITAL_COMMUNITY): Payer: Self-pay

## 2023-07-18 ENCOUNTER — Ambulatory Visit (HOSPITAL_COMMUNITY)
Admission: RE | Admit: 2023-07-18 | Discharge: 2023-07-18 | Disposition: A | Discharge status: Home / Self Care | Source: Ambulatory Visit | Attending: Oncology | Admitting: Oncology

## 2023-07-18 ENCOUNTER — Inpatient Hospital Stay: Attending: Oncology

## 2023-07-18 VITALS — BP 146/78 | HR 85 | Temp 98.5°F

## 2023-07-18 DIAGNOSIS — C50911 Malignant neoplasm of unspecified site of right female breast: Secondary | ICD-10-CM | POA: Insufficient documentation

## 2023-07-18 DIAGNOSIS — C7951 Secondary malignant neoplasm of bone: Secondary | ICD-10-CM | POA: Insufficient documentation

## 2023-07-18 LAB — BASIC METABOLIC PANEL - CANCER CENTER ONLY
Anion gap: 10 (ref 5–15)
BUN: 21 mg/dL (ref 8–23)
CO2: 28 mmol/L (ref 22–32)
Calcium: 10.5 mg/dL — ABNORMAL HIGH (ref 8.9–10.3)
Chloride: 100 mmol/L (ref 98–111)
Creatinine: 0.93 mg/dL (ref 0.44–1.00)
GFR, Estimated: >60 mL/min (ref >60)
Glucose, Bld: 193 mg/dL — ABNORMAL HIGH (ref 70–99)
Potassium: 3.8 mmol/L (ref 3.5–5.1)
Sodium: 138 mmol/L (ref 135–145)

## 2023-07-18 MED ORDER — SODIUM CHLORIDE 0.9 % IV SOLN: Freq: Once | INTRAVENOUS | Status: AC

## 2023-07-18 MED ORDER — ZOLEDRONIC ACID 4 MG/100ML IV SOLN
4.0000 mg | Freq: Once | INTRAVENOUS | Status: AC
  Administered 2023-07-18: 4 mg via INTRAVENOUS
  Filled 2023-07-18: qty 100

## 2023-07-18 MED ORDER — TECHNETIUM TC 99M MEDRONATE IV KIT
20.0000 | PACK | Freq: Once | INTRAVENOUS | Status: AC | PRN
  Administered 2023-07-18: 20.8 via INTRAVENOUS

## 2023-07-18 NOTE — Progress Notes (Signed)
 D/C to Nuc Med for Bone Scan.  IV left intact L inner forearm with good blood return but pt had slight flare reaction to zometa .  Line flushed well with NS, heat applied, no pain. Flare faded some.  Left cath intact for Nuc Med to evaluate.

## 2023-07-18 NOTE — Patient Instructions (Signed)

## 2023-07-25 ENCOUNTER — Ambulatory Visit: Payer: Self-pay | Admitting: Oncology

## 2023-08-23 DIAGNOSIS — H53021 Refractive amblyopia, right eye: Secondary | ICD-10-CM | POA: Diagnosis not present

## 2023-08-23 DIAGNOSIS — E113293 Type 2 diabetes mellitus with mild nonproliferative diabetic retinopathy without macular edema, bilateral: Secondary | ICD-10-CM | POA: Diagnosis not present

## 2023-08-23 DIAGNOSIS — D492 Neoplasm of unspecified behavior of bone, soft tissue, and skin: Secondary | ICD-10-CM | POA: Diagnosis not present

## 2023-08-23 DIAGNOSIS — H40023 Open angle with borderline findings, high risk, bilateral: Secondary | ICD-10-CM | POA: Diagnosis not present

## 2023-08-23 DIAGNOSIS — Z961 Presence of intraocular lens: Secondary | ICD-10-CM | POA: Diagnosis not present

## 2023-10-16 ENCOUNTER — Inpatient Hospital Stay: Admitting: Nurse Practitioner

## 2023-10-16 ENCOUNTER — Inpatient Hospital Stay

## 2023-10-16 ENCOUNTER — Inpatient Hospital Stay: Attending: Oncology

## 2023-10-16 ENCOUNTER — Encounter: Payer: Self-pay | Admitting: Nurse Practitioner

## 2023-10-16 VITALS — BP 132/64 | HR 60 | Temp 98.1°F | Resp 18 | Ht 62.0 in | Wt 131.5 lb

## 2023-10-16 DIAGNOSIS — Z79811 Long term (current) use of aromatase inhibitors: Secondary | ICD-10-CM | POA: Diagnosis not present

## 2023-10-16 DIAGNOSIS — E049 Nontoxic goiter, unspecified: Secondary | ICD-10-CM | POA: Insufficient documentation

## 2023-10-16 DIAGNOSIS — C50911 Malignant neoplasm of unspecified site of right female breast: Secondary | ICD-10-CM

## 2023-10-16 DIAGNOSIS — Z853 Personal history of malignant neoplasm of breast: Secondary | ICD-10-CM | POA: Insufficient documentation

## 2023-10-16 DIAGNOSIS — Z923 Personal history of irradiation: Secondary | ICD-10-CM | POA: Insufficient documentation

## 2023-10-16 DIAGNOSIS — R12 Heartburn: Secondary | ICD-10-CM | POA: Diagnosis not present

## 2023-10-16 DIAGNOSIS — C7951 Secondary malignant neoplasm of bone: Secondary | ICD-10-CM | POA: Diagnosis not present

## 2023-10-16 DIAGNOSIS — Z9221 Personal history of antineoplastic chemotherapy: Secondary | ICD-10-CM | POA: Insufficient documentation

## 2023-10-16 LAB — BASIC METABOLIC PANEL - CANCER CENTER ONLY
Anion gap: 17 — ABNORMAL HIGH (ref 5–15)
BUN: 13 mg/dL (ref 8–23)
CO2: 23 mmol/L (ref 22–32)
Calcium: 10.6 mg/dL — ABNORMAL HIGH (ref 8.9–10.3)
Chloride: 99 mmol/L (ref 98–111)
Creatinine: 0.87 mg/dL (ref 0.44–1.00)
GFR, Estimated: >60 mL/min (ref >60)
Glucose, Bld: 201 mg/dL — ABNORMAL HIGH (ref 70–99)
Potassium: 4.4 mmol/L (ref 3.5–5.1)
Sodium: 138 mmol/L (ref 135–145)

## 2023-10-16 MED ORDER — ZOLEDRONIC ACID 4 MG/100ML IV SOLN
4.0000 mg | Freq: Once | INTRAVENOUS | Status: DC
  Filled 2023-10-16: qty 100

## 2023-10-16 NOTE — Progress Notes (Signed)
 Cindy Frazier OFFICE PROGRESS NOTE   Diagnosis: Breast cancer  INTERVAL HISTORY:   Cindy Frazier returns as scheduled.  She continues Arimidex  and every 3 month Zometa .  She feels well in general.  No significant arthralgias.  No dental issues.  No new pain.  She has a good appetite.  No weight loss.  No change in bowel habits.  No urinary symptoms.  She denies bleeding.  No abdominal pain.  Only complaint is intermittent heartburn.  Objective:  Vital signs in last 24 hours:  Blood pressure 132/64, pulse 60, temperature 98.1 F (36.7 C), temperature source Temporal, resp. rate 18, height 5' 2 (1.575 m), weight 131 lb 8 oz (59.6 kg), SpO2 100%.    HEENT: No thrush or ulcers. Lymphatics: No palpable cervical, supraclavicular, axillary or inguinal lymph nodes. Resp: Lungs clear bilaterally. Cardio: Regular rate and rhythm. GI: No hepatosplenomegaly. Vascular: No leg edema. Skin: No rash. Breast: Status post right mastectomy with TRAM reconstruction.  No evidence for chest wall tumor recurrence.  No mass palpated left breast.  Lab Results:  Lab Results  Component Value Date   WBC 7.2 02/21/2013   HGB 13.8 02/21/2013   HCT 43.9 02/21/2013   MCV 83.5 02/21/2013   PLT 291 02/21/2013   NEUTROABS 4.2 02/21/2013    Imaging:  No results found.  Medications: I have reviewed the patient's current medications.  Assessment/Plan: Breast cancer-stage IIA (T2 N0) right breast cancer diagnosed in January 2000, status post a right mastectomy and TRAM reconstruction. She completed adjuvant chemotherapy followed by 5 years of tamoxifen and then 5 years of Femara. She completed Femara at the end of July 2010.   Right groin pain with an MRI and bone scan February 2015 concerning for metastatic disease involving the right acetabulum/ileum. Cycle 1 Zometa  03/21/2013   Status post SBRT to the right acetabulum completed 03/26/2013   Initiation of Arimidex  03/27/2013. Bone scan  09/23/2013. Metastatic disease in the right hemipelvis. Overall degree of activity decreased when compared to the prior exam. Bone scan 04/21/2014. Right pelvic/acetabular uptake slightly decreased since the prior exam. Bone scan 10/02/2014-unchanged metastasis at the right acetabulum, no new metastases, activity at the right greater than left mandible felt to reflect dental disease Bone scan 06/18/2015-no change in the right acetabulum metastasis. No new metastases.   Bone scan 04/22/2016-no change in right acetabulum metastasis, no new metastases CTs chest, abdomen, and pelvis on 12/28/2017- lytic/sclerotic lesion of the right acetabulum, no other bone lesions, no other evidence of metastatic disease in the chest, abdomen, and pelvis, multiple thyroid  nodules Bone scan 12/17/2018-new focal area of increased activity at the mid right cervical spine-felt to most likely be degenerative after review with radiology, unchanged activity at the right acetabulum CT right hip 06/28/2020-radiation changes involving the right acetabulum with lytic and sclerotic pattern, no evidence of recurrent tumor or pathologic fracture Bone scan 06/16/2021-slight increase in radiotracer at the left greater trochanter, stable uptake at the right acetabulum and superior pubic ramus, stable polyarticular degenerative changes, similar uptake in the mandible-periodontal disease? Bone scan 07/18/2023-no new suspicious uptake to suggest bone metastasis.  Stable findings compared to 2023. Status post CT guided right superior acetabular bone biopsy on 02/28/2013. Pathology showed metastatic carcinoma consistent with breast primary; ER positive (90-100%); PR weakly positive (5-10%); HER-2/neu not amplified; Ki-67 20%. CT chest/abdomen/pelvis 02/28/2013 with mild thyroid  enlargement and multiple low-attenuation lesions probably due to multinodular goiter; suggestion of asymmetry in the deep lateral central left breast; cholelithiasis; small  left renal cysts; degenerative changes throughout the spine; no lymphadenopathy in the abdomen or pelvis; no concerning solid organ lesions; mixed lytic and sclerotic destructive lesion of the right acetabulum. History of Elevated calcium level-likely related to taking a potassium supplement prior to the blood draws Thyroid  ultrasound 01/15/2018 - compared to June 2015-stable thyromegaly, bilateral nodules do not meet criteria for biopsy  Disposition: Ms. Delaluz remains in clinical remission from breast cancer.  She will continue anastrozole .  Left screening mammogram will be due January/February 2025.  She is maintained on every 37-month Zometa .  She will receive an infusion today.  She reports being up-to-date on dental visits.  She continues to have frequent heartburn.  We discussed a referral to gastroenterology.  She declines at this time.  We reviewed the basic metabolic panel from today.  Calcium remains mildly elevated.  Check again in 3 months.  She will return for lab and Zometa  in 3 months.  We will see her in follow-up in 6 months.  She will contact the office in the interim with any problems.    Cindy Frazier ANP/GNP-BC   10/16/2023  10:20 AM

## 2023-10-16 NOTE — Patient Instructions (Signed)

## 2023-10-16 NOTE — Progress Notes (Signed)
 Patient seen by Olam Ned NP today  Vitals are within treatment parameters:Yes   Labs are within treatment parameters: Yes   Treatment plan has been signed: Yes   Per physician team, Patient is ready for treatment and there are NO modifications to the treatment plan.

## 2023-10-17 ENCOUNTER — Inpatient Hospital Stay

## 2023-10-17 ENCOUNTER — Ambulatory Visit

## 2023-10-17 ENCOUNTER — Inpatient Hospital Stay: Admitting: Oncology

## 2023-10-19 ENCOUNTER — Ambulatory Visit

## 2023-10-19 VITALS — BP 138/62 | HR 60 | Temp 98.2°F | Resp 16

## 2023-10-19 MED ORDER — HEPARIN SOD (PORK) LOCK FLUSH 100 UNIT/ML IV SOLN: 500.0000 [IU] | Freq: Once | INTRAVENOUS | Status: DC | PRN

## 2023-10-19 MED ORDER — SODIUM CHLORIDE 0.9% FLUSH: 3.0000 mL | Freq: Once | INTRAVENOUS | Status: DC | PRN

## 2023-10-19 MED ORDER — SODIUM CHLORIDE 0.9% FLUSH: 10.0000 mL | INTRAVENOUS | Status: DC | PRN

## 2023-10-19 MED ORDER — ZOLEDRONIC ACID 4 MG/100ML IV SOLN
4.0000 mg | Freq: Once | INTRAVENOUS | Status: DC
  Filled 2023-10-19: qty 100

## 2023-10-19 MED ORDER — ALTEPLASE 2 MG IJ SOLR: 2.0000 mg | Freq: Once | INTRAMUSCULAR | Status: DC | PRN

## 2023-10-19 MED ORDER — HEPARIN SOD (PORK) LOCK FLUSH 100 UNIT/ML IV SOLN: 250.0000 [IU] | Freq: Once | INTRAVENOUS | Status: DC | PRN

## 2023-10-19 NOTE — Patient Instructions (Signed)
 [facility] - A DEPT OF [facility]  Discharge Instructions: Thank you for choosing [facility] to provide your oncology and hematology care.   If you have a lab appointment with the Cancer Center, please go directly to the Cancer Center and check in at the registration area.   Wear comfortable clothing and clothing appropriate for easy access to any Portacath or PICC line.   We strive to give you quality time with your provider. You may need to reschedule your appointment if you arrive late (15 or more minutes).  Arriving late affects you and other patients whose appointments are after yours.  Also, if you miss three or more appointments without notifying the office, you may be dismissed from the clinic at the provider's discretion.      For prescription refill requests, have your pharmacy contact our office and allow 72 hours for refills to be completed.    Today you received the following chemotherapy and/or immunotherapy agents: none.     Should you have questions after your visit or need to cancel or reschedule your appointment, please contact [facility] - A DEPT OF [facility]  Dept: [phone]  and follow the prompts.  Office hours are 8:00 a.m. to 4:30 p.m. Monday - Friday. Please note that voicemails left after 4:00 p.m. may not be returned until the following business day.  We are closed weekends and major holidays. You have access to a nurse at all times for urgent questions. Please call the main number to the clinic Dept: [phone] and follow the prompts.   For any non-urgent questions, you may also contact your provider using MyChart. We now offer e-Visits for anyone 40 and older to request care online for non-urgent symptoms. For details visit [email or web address].   Also download the MyChart app! Go to the app store, search MyChart, open the app, select [facility], and log in with your MyChart  username and password.

## 2023-10-19 NOTE — Progress Notes (Signed)
 Pt initially came for IV Zometa  on 10/16/23 but was unable to obtain IV access. She chose to hydrate at home for several days and now returns for treatment.   Pt has a h/o right sided mastectomy 25 years ago. Two unsuccessful IV attempts were made in her left arm. No lymphedema in right arm. Policy allows an IV stick in the affected arm as long as no active lymphedema. Reviewed with Olam Ned, NP and she advised Dr. Cloretta prefers no stick in the affected arm. Cancel today's treatment and return for Zometa  again in three months. Pt agrees.   Next treatment: Richerd KIDD, RN suggests asking pt if she would consider going to [facility] since they have an IV team and ultrasound available. Pt has preferred to come here up to this point.

## 2023-10-22 ENCOUNTER — Other Ambulatory Visit: Payer: Self-pay | Admitting: Oncology

## 2023-10-22 DIAGNOSIS — C50919 Malignant neoplasm of unspecified site of unspecified female breast: Secondary | ICD-10-CM

## 2023-10-23 ENCOUNTER — Encounter: Payer: Self-pay | Admitting: Oncology

## 2023-12-20 DIAGNOSIS — E7800 Pure hypercholesterolemia, unspecified: Secondary | ICD-10-CM | POA: Diagnosis not present

## 2023-12-20 DIAGNOSIS — E1165 Type 2 diabetes mellitus with hyperglycemia: Secondary | ICD-10-CM | POA: Diagnosis not present

## 2023-12-20 DIAGNOSIS — J988 Other specified respiratory disorders: Secondary | ICD-10-CM | POA: Diagnosis not present

## 2023-12-20 DIAGNOSIS — J029 Acute pharyngitis, unspecified: Secondary | ICD-10-CM | POA: Diagnosis not present

## 2023-12-20 DIAGNOSIS — C7951 Secondary malignant neoplasm of bone: Secondary | ICD-10-CM | POA: Diagnosis not present

## 2023-12-20 DIAGNOSIS — R051 Acute cough: Secondary | ICD-10-CM | POA: Diagnosis not present

## 2023-12-20 DIAGNOSIS — E1169 Type 2 diabetes mellitus with other specified complication: Secondary | ICD-10-CM | POA: Diagnosis not present

## 2023-12-20 DIAGNOSIS — I10 Essential (primary) hypertension: Secondary | ICD-10-CM | POA: Diagnosis not present

## 2023-12-20 DIAGNOSIS — C50919 Malignant neoplasm of unspecified site of unspecified female breast: Secondary | ICD-10-CM | POA: Diagnosis not present

## 2024-01-09 ENCOUNTER — Telehealth: Payer: Self-pay | Admitting: *Deleted

## 2024-01-09 NOTE — Telephone Encounter (Signed)
 Called to inquire if she is having Bmet on 01/16/24 (PCP wants a repeat). Confirmed that she is having a Bmet on 1/7 and result will be routed to Dr. Vernon.

## 2024-01-15 ENCOUNTER — Other Ambulatory Visit: Payer: Self-pay | Admitting: Oncology

## 2024-01-15 DIAGNOSIS — Z1231 Encounter for screening mammogram for malignant neoplasm of breast: Secondary | ICD-10-CM

## 2024-01-16 ENCOUNTER — Inpatient Hospital Stay: Attending: Oncology

## 2024-01-16 ENCOUNTER — Inpatient Hospital Stay

## 2024-01-16 VITALS — BP 153/64 | HR 70 | Temp 98.1°F | Resp 16

## 2024-01-16 DIAGNOSIS — C50911 Malignant neoplasm of unspecified site of right female breast: Secondary | ICD-10-CM

## 2024-01-16 DIAGNOSIS — C7951 Secondary malignant neoplasm of bone: Secondary | ICD-10-CM | POA: Diagnosis present

## 2024-01-16 DIAGNOSIS — Z853 Personal history of malignant neoplasm of breast: Secondary | ICD-10-CM | POA: Insufficient documentation

## 2024-01-16 LAB — BASIC METABOLIC PANEL - CANCER CENTER ONLY
Anion gap: 13 (ref 5–15)
BUN: 18 mg/dL (ref 8–23)
CO2: 25 mmol/L (ref 22–32)
Calcium: 10.2 mg/dL (ref 8.9–10.3)
Chloride: 99 mmol/L (ref 98–111)
Creatinine: 0.89 mg/dL (ref 0.44–1.00)
GFR, Estimated: >60 mL/min
Glucose, Bld: 149 mg/dL — ABNORMAL HIGH (ref 70–99)
Potassium: 4.2 mmol/L (ref 3.5–5.1)
Sodium: 137 mmol/L (ref 135–145)

## 2024-01-16 MED ORDER — HEPARIN SOD (PORK) LOCK FLUSH 100 UNIT/ML IV SOLN: 500.0000 [IU] | Freq: Once | INTRAVENOUS | Status: DC | PRN

## 2024-01-16 MED ORDER — HEPARIN SOD (PORK) LOCK FLUSH 100 UNIT/ML IV SOLN: 250.0000 [IU] | Freq: Once | INTRAVENOUS | Status: DC | PRN

## 2024-01-16 MED ORDER — SODIUM CHLORIDE 0.9% FLUSH: 3.0000 mL | Freq: Once | INTRAVENOUS | Status: DC | PRN

## 2024-01-16 MED ORDER — ALTEPLASE 2 MG IJ SOLR: 2.0000 mg | Freq: Once | INTRAMUSCULAR | Status: DC | PRN

## 2024-01-16 MED ORDER — SODIUM CHLORIDE 0.9% FLUSH: 10.0000 mL | INTRAVENOUS | Status: DC | PRN

## 2024-01-16 MED ORDER — ZOLEDRONIC ACID 4 MG/100ML IV SOLN
4.0000 mg | Freq: Once | INTRAVENOUS | Status: AC
  Administered 2024-01-16: 4 mg via INTRAVENOUS
  Filled 2024-01-16: qty 100

## 2024-01-16 NOTE — Patient Instructions (Signed)

## 2024-02-11 ENCOUNTER — Ambulatory Visit

## 2024-02-18 ENCOUNTER — Ambulatory Visit

## 2024-04-21 ENCOUNTER — Ambulatory Visit

## 2024-04-21 ENCOUNTER — Ambulatory Visit: Admitting: Oncology

## 2024-04-21 ENCOUNTER — Other Ambulatory Visit
